# Patient Record
Sex: Male | Born: 1970
Health system: Southern US, Community
[De-identification: ages and names within clinical notes are randomized; demographics above are authoritative.]

## PROBLEM LIST (undated history)

## (undated) DIAGNOSIS — Z87442 Personal history of urinary calculi: Secondary | ICD-10-CM

## (undated) DIAGNOSIS — M1990 Unspecified osteoarthritis, unspecified site: Secondary | ICD-10-CM

## (undated) DIAGNOSIS — G479 Sleep disorder, unspecified: Secondary | ICD-10-CM

## (undated) DIAGNOSIS — M87051 Idiopathic aseptic necrosis of right femur: Secondary | ICD-10-CM

## (undated) HISTORY — PX: JOINT REPLACEMENT: SHX530

---

## 2010-10-20 ENCOUNTER — Emergency Department (HOSPITAL_COMMUNITY)
Admission: EM | Admit: 2010-10-20 | Discharge: 2010-10-21 | Disposition: A | Discharge status: Home / Self Care | Payer: BC Managed Care – PPO | Attending: Emergency Medicine | Admitting: Emergency Medicine

## 2010-10-20 ENCOUNTER — Emergency Department (HOSPITAL_COMMUNITY): Payer: BC Managed Care – PPO

## 2010-10-20 DIAGNOSIS — R109 Unspecified abdominal pain: Secondary | ICD-10-CM | POA: Insufficient documentation

## 2010-10-20 DIAGNOSIS — M48061 Spinal stenosis, lumbar region without neurogenic claudication: Secondary | ICD-10-CM | POA: Insufficient documentation

## 2010-10-20 DIAGNOSIS — E860 Dehydration: Secondary | ICD-10-CM | POA: Insufficient documentation

## 2010-10-20 DIAGNOSIS — Z87442 Personal history of urinary calculi: Secondary | ICD-10-CM | POA: Insufficient documentation

## 2010-10-20 LAB — DIFFERENTIAL
Eosinophils Absolute: 0.2 10*3/uL (ref 0.0–0.7)
Eosinophils Relative: 2 % (ref 0–5)
Lymphs Abs: 2.6 10*3/uL (ref 0.7–4.0)
Monocytes Relative: 9 % (ref 3–12)

## 2010-10-20 LAB — URINALYSIS, ROUTINE W REFLEX MICROSCOPIC
Glucose, UA: NEGATIVE mg/dL
Hgb urine dipstick: NEGATIVE
Ketones, ur: NEGATIVE mg/dL
Protein, ur: NEGATIVE mg/dL

## 2010-10-20 LAB — CBC
MCH: 31.5 pg (ref 26.0–34.0)
MCV: 93.2 fL (ref 78.0–100.0)
Platelets: 287 10*3/uL (ref 150–400)
RBC: 5.02 MIL/uL (ref 4.22–5.81)
RDW: 14.5 % (ref 11.5–15.5)

## 2010-10-21 LAB — POCT I-STAT, CHEM 8
BUN: 25 mg/dL — ABNORMAL HIGH (ref 6–23)
Calcium, Ion: 1.07 mmol/L — ABNORMAL LOW (ref 1.12–1.32)
Calcium, Ion: 1.16 mmol/L (ref 1.12–1.32)
Chloride: 109 mEq/L (ref 96–112)
Creatinine, Ser: 1.3 mg/dL (ref 0.50–1.35)
Creatinine, Ser: 1.4 mg/dL — ABNORMAL HIGH (ref 0.50–1.35)
Glucose, Bld: 94 mg/dL (ref 70–99)
HCT: 48 % (ref 39.0–52.0)
TCO2: 27 mmol/L (ref 0–100)

## 2013-09-05 HISTORY — PX: BACK SURGERY: SHX140

## 2014-05-23 ENCOUNTER — Ambulatory Visit
Admission: RE | Admit: 2014-05-23 | Discharge: 2014-05-23 | Disposition: A | Discharge status: Home / Self Care | Payer: BLUE CROSS/BLUE SHIELD | Source: Ambulatory Visit | Attending: Physical Medicine and Rehabilitation | Admitting: Physical Medicine and Rehabilitation

## 2014-05-23 ENCOUNTER — Other Ambulatory Visit: Payer: Self-pay | Admitting: Physical Medicine and Rehabilitation

## 2014-05-23 DIAGNOSIS — M25551 Pain in right hip: Secondary | ICD-10-CM

## 2014-05-26 ENCOUNTER — Other Ambulatory Visit: Payer: Self-pay | Admitting: Physical Medicine and Rehabilitation

## 2014-05-26 DIAGNOSIS — M1990 Unspecified osteoarthritis, unspecified site: Secondary | ICD-10-CM

## 2014-05-29 ENCOUNTER — Ambulatory Visit
Admission: RE | Admit: 2014-05-29 | Discharge: 2014-05-29 | Disposition: A | Discharge status: Home / Self Care | Payer: BLUE CROSS/BLUE SHIELD | Source: Ambulatory Visit | Attending: Physical Medicine and Rehabilitation | Admitting: Physical Medicine and Rehabilitation

## 2014-05-29 ENCOUNTER — Other Ambulatory Visit: Payer: Self-pay | Admitting: Physical Medicine and Rehabilitation

## 2014-05-29 DIAGNOSIS — Z139 Encounter for screening, unspecified: Secondary | ICD-10-CM

## 2014-06-01 ENCOUNTER — Ambulatory Visit
Admission: RE | Admit: 2014-06-01 | Discharge: 2014-06-01 | Disposition: A | Discharge status: Home / Self Care | Payer: BLUE CROSS/BLUE SHIELD | Source: Ambulatory Visit | Attending: Physical Medicine and Rehabilitation | Admitting: Physical Medicine and Rehabilitation

## 2014-06-01 DIAGNOSIS — M1990 Unspecified osteoarthritis, unspecified site: Secondary | ICD-10-CM

## 2014-07-05 NOTE — Progress Notes (Signed)
Please put orders in Epic surgery 07-18-14 pre op 07-06-14 Thanks

## 2014-07-05 NOTE — Patient Instructions (Addendum)
Alec Key  07/05/2014   Your procedure is scheduled on: 07/18/14   Report to [facility] Main  Entrance and follow signs to               Short Stay Center at 11:15 Key.   Call this number if you have problems the morning of surgery [phone]   Remember:  Do not eat food After Midnight.                             Alec Key               CLEAR LIQUID DIET   Foods Allowed                                                                     Foods Excluded  Coffee and tea, regular and decaf                             liquids that you cannot  Plain Jell-O in any flavor                                             see through such as: Fruit ices (not with fruit pulp)                                     milk, soups, orange juice  Iced Popsicles                                                All solid food Carbonated beverages, regular and diet                                    Cranberry, grape and apple juices Sports drinks like Gatorade Lightly seasoned clear broth or consume(fat free) Sugar, honey syrup   _____________________________________________________________________   Take these medicines the morning of surgery with A SIP OF WATER: TRAMADOL IF NEEDED                               You Alec not have any metal on your body including hair pins and              piercings  Do not wear jewelry, make-up, lotions, powders or perfumes.             Do not wear nail polish.  Do not shave  48 hours prior to surgery.              Men Alec shave face and neck.   Do not bring valuables to the  hospital. [facility] IS NOT             RESPONSIBLE   FOR VALUABLES.  Contacts, dentures or bridgework Alec not be worn into surgery.  Leave suitcase in the car. After surgery it Alec be brought to your room.     Patients discharged the day of surgery will not be allowed to drive home.  Name and phone number of your driver:  Special  Instructions: N/A              Please read over the following fact sheets you were given: _____________________________________________________________________                                                     [name] - PREPARING FOR SURGERY  Before surgery, you can play an important role.  Because skin is not sterile, your skin needs to be as free of germs as possible.  You can reduce the number of germs on your skin by washing with CHG (chlorahexidine gluconate) soap before surgery.  CHG is an antiseptic cleaner which kills germs and bonds with the skin to continue killing germs even after washing. Please DO NOT use if you have an allergy to CHG or antibacterial soaps.  If your skin becomes reddened/irritated stop using the CHG and inform your nurse when you arrive at Short Stay. Do not shave (including legs and underarms) for at least 48 hours prior to the first CHG shower.  You Alec shave your face. Please follow these instructions carefully:   1.  Shower with CHG Soap the night before surgery and the  morning of Surgery.   2.  If you choose to wash your hair, wash your hair first as usual with your  normal  Shampoo.   3.  After you shampoo, rinse your hair and body thoroughly to remove the  shampoo.                                         4.  Use CHG as you would any other liquid soap.  You can apply chg directly  to the skin and wash . Gently wash with scrungie or clean wascloth    5.  Apply the CHG Soap to your body ONLY FROM THE NECK DOWN.   Do not use on open                           Wound or open sores. Avoid contact with eyes, ears mouth and genitals (private parts).                        Genitals (private parts) with your normal soap.              6.  Wash thoroughly, paying special attention to the area where your surgery  will be performed.   7.  Thoroughly rinse your body with warm water from the neck down.   8.  DO NOT shower/wash with your normal soap after  using and rinsing off  the CHG Soap .                9.  Pat yourself  dry with a clean towel.             10.  Wear clean pajamas.             11.  Place clean sheets on your bed the night of your first shower and do not  sleep with pets.  Day of Surgery : Do not apply any lotions/deodorants the morning of surgery.  Please wear clean clothes to the hospital/surgery center.  FAILURE TO FOLLOW THESE INSTRUCTIONS Alec RESULT IN THE CANCELLATION OF YOUR SURGERY    PATIENT SIGNATURE_________________________________  ______________________________________________________________________     Rogelia MireIncentive Spirometer  An incentive spirometer is a tool that can help keep your lungs clear and active. This tool measures how well you are filling your lungs with each breath. Taking long deep breaths Alec help reverse or decrease the chance of developing breathing (pulmonary) problems (especially infection) following:  A long period of time when you are unable to move or be active. BEFORE THE PROCEDURE   If the spirometer includes an indicator to show your best effort, your nurse or respiratory therapist will set it to a desired goal.  If possible, sit up straight or lean slightly forward. Try not to slouch.  Hold the incentive spirometer in an upright position. INSTRUCTIONS FOR USE   Sit on the edge of your bed if possible, or sit up as far as you can in bed or on a chair.  Hold the incentive spirometer in an upright position.  Breathe out normally.  Place the mouthpiece in your mouth and seal your lips tightly around it.  Breathe in slowly and as deeply as possible, raising the piston or the ball toward the top of the column.  Hold your breath for 3-5 seconds or for as long as possible. Allow the piston or ball to fall to the bottom of the column.  Remove the mouthpiece from your mouth and breathe out normally.  Rest for a few seconds and repeat Steps 1 through 7 at least 10 times  every 1-2 hours when you are awake. Take your time and take a few normal breaths between deep breaths.  The spirometer Alec include an indicator to show your best effort. Use the indicator as a goal to work toward during each repetition.  After each set of 10 deep breaths, practice coughing to be sure your lungs are clear. If you have an incision (the cut made at the time of surgery), support your incision when coughing by placing a pillow or rolled up towels firmly against it. Once you are able to get out of bed, walk around indoors and cough well. You Alec stop using the incentive spirometer when instructed by your caregiver.  RISKS AND COMPLICATIONS  Take your time so you do not get dizzy or light-headed.  If you are in pain, you Alec need to take or ask for pain medication before doing incentive spirometry. It is harder to take a deep breath if you are having pain. AFTER USE  Rest and breathe slowly and easily.  It can be helpful to keep track of a log of your progress. Your caregiver can provide you with a simple table to help with this. If you are using the spirometer at home, follow these instructions: SEEK MEDICAL CARE IF:   You are having difficultly using the spirometer.  You have trouble using the spirometer as often as instructed.  Your pain medication is not giving enough relief while using the spirometer.  You  develop fever of 100.5 F (38.1 C) or higher. SEEK IMMEDIATE MEDICAL CARE IF:   You cough up bloody sputum that had not been present before.  You develop fever of 102 F (38.9 C) or greater.  You develop worsening pain at or near the incision site. MAKE SURE YOU:   Understand these instructions.  Will watch your condition.  Will get help right away if you are not doing well or get worse. Document Released: 08/04/2006 Document Revised: 06/16/2011 Document Reviewed: 10/05/2006 Exit[employer] Patient Information 2014 ExitCare,  [place].   ________________________________________________________________________  WHAT IS A BLOOD TRANSFUSION? Blood Transfusion Information  A transfusion is the replacement of blood or some of its parts. Blood is made up of multiple cells which provide different functions.  Red blood cells carry oxygen and are used for blood loss replacement.  White blood cells fight against infection.  Platelets control bleeding.  Plasma helps clot blood.  Other blood products are available for specialized needs, such as hemophilia or other clotting disorders. BEFORE THE TRANSFUSION  Who gives blood for transfusions?   Healthy volunteers who are fully evaluated to make sure their blood is safe. This is blood bank blood. Transfusion therapy is the safest it has ever been in the practice of medicine. Before blood is taken from a donor, a complete history is taken to make sure that person has no history of diseases nor engages in risky social behavior (examples are intravenous drug use or sexual activity with multiple partners). The donor's travel history is screened to minimize risk of transmitting infections, such as malaria. The donated blood is tested for signs of infectious diseases, such as HIV and hepatitis. The blood is then tested to be sure it is compatible with you in order to minimize the chance of a transfusion reaction. If you or a relative donates blood, this is often done in anticipation of surgery and is not appropriate for emergency situations. It takes many days to process the donated blood. RISKS AND COMPLICATIONS Although transfusion therapy is very safe and saves many lives, the main dangers of transfusion include:   Getting an infectious disease.  Developing a transfusion reaction. This is an allergic reaction to something in the blood you were given. Every precaution is taken to prevent this. The decision to have a blood transfusion has been considered carefully by your caregiver  before blood is given. Blood is not given unless the benefits outweigh the risks. AFTER THE TRANSFUSION  Right after receiving a blood transfusion, you will usually feel much better and more energetic. This is especially true if your red blood cells have gotten low (anemic). The transfusion raises the level of the red blood cells which carry oxygen, and this usually causes an energy increase.  The nurse administering the transfusion will monitor you carefully for complications. HOME CARE INSTRUCTIONS  No special instructions are needed after a transfusion. You Alec find your energy is better. Speak with your caregiver about any limitations on activity for underlying diseases you Alec have. SEEK MEDICAL CARE IF:   Your condition is not improving after your transfusion.  You develop redness or irritation at the intravenous (IV) site. SEEK IMMEDIATE MEDICAL CARE IF:  Any of the following symptoms occur over the next 12 hours:  Shaking chills.  You have a temperature by mouth above 102 F (38.9 C), not controlled by medicine.  Chest, back, or muscle pain.  People around you feel you are not acting correctly or are confused.  Shortness of  breath or difficulty breathing.  Dizziness and fainting.  You get a rash or develop hives.  You have a decrease in urine output.  Your urine turns a dark color or changes to pink, red, or brown. Any of the following symptoms occur over the next 10 days:  You have a temperature by mouth above 102 F (38.9 C), not controlled by medicine.  Shortness of breath.  Weakness after normal activity.  The white part of the eye turns yellow (jaundice).  You have a decrease in the amount of urine or are urinating less often.  Your urine turns a dark color or changes to pink, red, or brown. Document Released: 03/21/2000 Document Revised: 06/16/2011 Document Reviewed: 11/08/2007 [facility] Patient Information 2014 [place],  [place].  _______________________________________________________________________

## 2014-07-06 ENCOUNTER — Encounter (HOSPITAL_COMMUNITY)
Admission: RE | Admit: 2014-07-06 | Discharge: 2014-07-06 | Disposition: A | Discharge status: Home / Self Care | Payer: BLUE CROSS/BLUE SHIELD | Source: Ambulatory Visit | Attending: Orthopedic Surgery | Admitting: Orthopedic Surgery

## 2014-07-06 ENCOUNTER — Encounter (HOSPITAL_COMMUNITY): Payer: Self-pay

## 2014-07-06 DIAGNOSIS — Z01812 Encounter for preprocedural laboratory examination: Secondary | ICD-10-CM | POA: Insufficient documentation

## 2014-07-06 HISTORY — DX: Idiopathic aseptic necrosis of right femur: M87.051

## 2014-07-06 HISTORY — DX: Sleep disorder, unspecified: G47.9

## 2014-07-06 HISTORY — DX: Unspecified osteoarthritis, unspecified site: M19.90

## 2014-07-06 HISTORY — DX: Personal history of urinary calculi: Z87.442

## 2014-07-06 LAB — URINALYSIS, ROUTINE W REFLEX MICROSCOPIC
Bilirubin Urine: NEGATIVE
Glucose, UA: NEGATIVE mg/dL
Hgb urine dipstick: NEGATIVE
KETONES UR: NEGATIVE mg/dL
LEUKOCYTES UA: NEGATIVE
Nitrite: NEGATIVE
PROTEIN: NEGATIVE mg/dL
Specific Gravity, Urine: 1.026 (ref 1.005–1.030)
Urobilinogen, UA: 0.2 mg/dL (ref 0.0–1.0)
pH: 5 (ref 5.0–8.0)

## 2014-07-06 LAB — BASIC METABOLIC PANEL
Anion gap: 9 (ref 5–15)
BUN: 15 mg/dL (ref 6–23)
CALCIUM: 9.2 mg/dL (ref 8.4–10.5)
CO2: 28 mmol/L (ref 19–32)
Chloride: 102 mmol/L (ref 96–112)
Creatinine, Ser: 1.08 mg/dL (ref 0.50–1.35)
GFR calc Af Amer: >90 mL/min (ref >90)
GFR, EST NON AFRICAN AMERICAN: 83 mL/min — AB (ref >90)
GLUCOSE: 95 mg/dL (ref 70–99)
Potassium: 4.5 mmol/L (ref 3.5–5.1)
SODIUM: 139 mmol/L (ref 135–145)

## 2014-07-06 LAB — PROTIME-INR
INR: 0.94 (ref 0.00–1.49)
PROTHROMBIN TIME: 12.7 s (ref 11.6–15.2)

## 2014-07-06 LAB — CBC
HCT: 47.4 % (ref 39.0–52.0)
HEMOGLOBIN: 15.8 g/dL (ref 13.0–17.0)
MCH: 31 pg (ref 26.0–34.0)
MCHC: 33.3 g/dL (ref 30.0–36.0)
MCV: 93.1 fL (ref 78.0–100.0)
Platelets: 310 10*3/uL (ref 150–400)
RBC: 5.09 MIL/uL (ref 4.22–5.81)
RDW: 14.2 % (ref 11.5–15.5)
WBC: 12.8 10*3/uL — AB (ref 4.0–10.5)

## 2014-07-06 LAB — APTT: aPTT: 34 seconds (ref 24–37)

## 2014-07-06 LAB — SURGICAL PCR SCREEN
MRSA, PCR: INVALID — AB
STAPHYLOCOCCUS AUREUS: INVALID — AB

## 2014-07-10 NOTE — Progress Notes (Signed)
PCR was not sent for culture by the lab.  Will be repeated on day of surgery.

## 2014-07-16 NOTE — H&P (Signed)
TOTAL HIP ADMISSION H&P  Patient is admitted for right total hip arthroplasty, anterior approach (ceramic-on-ceramic).  Subjective:  Chief Complaint:  Right hip AVN / pain  HPI: Deno EtienneMark Fristoe, 44 y.o. male, has a history of pain and functional disability in the right hip(s) due to arthritis and AVN and patient has failed non-surgical conservative treatments for greater than 12 weeks to include NSAID's and/or analgesics, corticosteriod injections and activity modification.  Onset of symptoms was gradual starting 3+ years ago with gradually worsening course since that time.The patient noted no past surgery on the right hip(s).  Patient currently rates pain in the right hip at 9 out of 10 with activity. Patient has night pain, worsening of pain with activity and weight bearing, trendelenberg gait, pain that interfers with activities of daily living and pain with passive range of motion. Patient has evidence of periarticular osteophytes and joint space narrowing by imaging studies. This condition presents safety issues increasing the risk of falls.  There is no current active infection.  Risks, benefits and expectations were discussed with the patient.  Risks including but not limited to the risk of anesthesia, blood clots, nerve damage, blood vessel damage, failure of the prosthesis, infection and up to and including death.  Patient understand the risks, benefits and expectations and wishes to proceed with surgery.   PCP: Johny BlamerHARRIS, WILLIAM, MD  D/C Plans:      Home with HHPT (Outpatient)  Post-op Meds:       No Rx given  Tranexamic Acid:      To be given - IV    Decadron:      Is to be given  FYI:     ASA post-op  Norco post-op    Past Medical History  Diagnosis Date  . Arthritis   . History of kidney stones   . Difficulty sleeping     DUE TO HIP PAIN  . Avascular necrosis of bone of right hip     Past Surgical History  Procedure Laterality Date  . Back surgery  june 2015    L4-L5  DISCECTOMY    No prescriptions prior to admission   No Known Allergies   History  Substance Use Topics  . Smoking status: Current Every Day Smoker -- 1.50 packs/day  . Smokeless tobacco: Not on file  . Alcohol Use: Yes     Comment: 4-5 BEERS DAILY       Review of Systems  Constitutional: Negative.   HENT: Negative.   Eyes: Negative.   Respiratory: Negative.   Cardiovascular: Negative.   Gastrointestinal: Negative.   Genitourinary: Negative.   Musculoskeletal: Positive for joint pain.  Skin: Negative.   Neurological: Negative.   Endo/Heme/Allergies: Negative.   Psychiatric/Behavioral: Negative.     Objective:  Physical Exam  Constitutional: He is oriented to person, place, and time. He appears well-developed and well-nourished.  HENT:  Head: Normocephalic.  Eyes: Pupils are equal, round, and reactive to light.  Neck: Neck supple. No JVD present. No tracheal deviation present. No thyromegaly present.  Cardiovascular: Normal rate, regular rhythm, normal heart sounds and intact distal pulses.   Respiratory: Effort normal and breath sounds normal. No stridor. No respiratory distress. He has no wheezes.  GI: Soft. There is no tenderness. There is no guarding.  Musculoskeletal:       Right hip: He exhibits decreased range of motion, decreased strength, tenderness and bony tenderness. He exhibits no swelling, no deformity and no laceration.  Lymphadenopathy:    He has  no cervical adenopathy.  Neurological: He is alert and oriented to person, place, and time.  Skin: Skin is warm and dry.  Psychiatric: He has a normal mood and affect.       Imaging Review Plain radiographs demonstrate severe degenerative joint disease of the right hip(s). The bone quality appears to be good for age and reported activity level.  Assessment/Plan:  End stage arthritis / AVN, right hip(s)  The patient history, physical examination, clinical judgement of the provider and imaging studies  are consistent with end stage degenerative joint disease / AVN of the right hip(s) and total hip arthroplasty is deemed medically necessary. The treatment options including medical management, injection therapy, arthroscopy and arthroplasty were discussed at length. The risks and benefits of total hip arthroplasty were presented and reviewed. The risks due to aseptic loosening, infection, stiffness, dislocation/subluxation,  thromboembolic complications and other imponderables were discussed.  The patient acknowledged the explanation, agreed to proceed with the plan and consent was signed. Patient is being admitted for inpatient treatment for surgery, pain control, PT, OT, prophylactic antibiotics, VTE prophylaxis, progressive ambulation and ADL's and discharge planning.The patient is planning to be discharged home with home health services.     Anastasio Auerbach Janice Seales   PA-C  07/16/2014, 2:08 PM

## 2014-07-18 ENCOUNTER — Ambulatory Visit (HOSPITAL_COMMUNITY): Payer: BLUE CROSS/BLUE SHIELD | Admitting: Anesthesiology

## 2014-07-18 ENCOUNTER — Encounter (HOSPITAL_COMMUNITY): Payer: Self-pay | Admitting: *Deleted

## 2014-07-18 ENCOUNTER — Ambulatory Visit (HOSPITAL_COMMUNITY): Payer: BLUE CROSS/BLUE SHIELD

## 2014-07-18 ENCOUNTER — Encounter (HOSPITAL_COMMUNITY)
Admission: RE | Disposition: A | Discharge status: Home / Self Care | Payer: Self-pay | Source: Ambulatory Visit | Attending: Orthopedic Surgery

## 2014-07-18 ENCOUNTER — Ambulatory Visit (HOSPITAL_COMMUNITY)
Admission: RE | Admit: 2014-07-18 | Discharge: 2014-07-18 | Disposition: A | Discharge status: Home / Self Care | Payer: BLUE CROSS/BLUE SHIELD | Source: Ambulatory Visit | Attending: Orthopedic Surgery | Admitting: Orthopedic Surgery

## 2014-07-18 DIAGNOSIS — M1990 Unspecified osteoarthritis, unspecified site: Secondary | ICD-10-CM | POA: Diagnosis not present

## 2014-07-18 DIAGNOSIS — Z96649 Presence of unspecified artificial hip joint: Secondary | ICD-10-CM

## 2014-07-18 DIAGNOSIS — Z87442 Personal history of urinary calculi: Secondary | ICD-10-CM | POA: Diagnosis not present

## 2014-07-18 DIAGNOSIS — M879 Osteonecrosis, unspecified: Secondary | ICD-10-CM | POA: Insufficient documentation

## 2014-07-18 DIAGNOSIS — F17210 Nicotine dependence, cigarettes, uncomplicated: Secondary | ICD-10-CM | POA: Insufficient documentation

## 2014-07-18 HISTORY — PX: TOTAL HIP ARTHROPLASTY: SHX124

## 2014-07-18 LAB — SURGICAL PCR SCREEN
MRSA, PCR: NEGATIVE
Staphylococcus aureus: NEGATIVE

## 2014-07-18 LAB — ABO/RH: ABO/RH(D): O POS

## 2014-07-18 LAB — TYPE AND SCREEN
ABO/RH(D): O POS
ANTIBODY SCREEN: NEGATIVE

## 2014-07-18 SURGERY — ARTHROPLASTY, HIP, TOTAL, ANTERIOR APPROACH
Anesthesia: Spinal | Site: Hip | Laterality: Right

## 2014-07-18 MED ORDER — MIDAZOLAM HCL 2 MG/2ML IJ SOLN
INTRAMUSCULAR | Status: AC
  Filled 2014-07-18: qty 2

## 2014-07-18 MED ORDER — METHOCARBAMOL 500 MG PO TABS
500.0000 mg | ORAL_TABLET | Freq: Four times a day (QID) | ORAL | Status: DC
  Administered 2014-07-18: 500 mg via ORAL
  Filled 2014-07-18: qty 1

## 2014-07-18 MED ORDER — PROPOFOL 10 MG/ML IV BOLUS
INTRAVENOUS | Status: AC
  Filled 2014-07-18: qty 20

## 2014-07-18 MED ORDER — METHOCARBAMOL 500 MG PO TABS: 500.0000 mg | ORAL_TABLET | Freq: Four times a day (QID) | ORAL | Status: DC | PRN

## 2014-07-18 MED ORDER — BUPIVACAINE IN DEXTROSE 0.75-8.25 % IT SOLN
INTRATHECAL | Status: DC | PRN
  Administered 2014-07-18: 2 mL via INTRATHECAL

## 2014-07-18 MED ORDER — LACTATED RINGERS IV SOLN
INTRAVENOUS | Status: DC | PRN
  Administered 2014-07-18 (×2): via INTRAVENOUS

## 2014-07-18 MED ORDER — PROPOFOL 10 MG/ML IV BOLUS
INTRAVENOUS | Status: DC | PRN
  Administered 2014-07-18 (×3): 20 mg via INTRAVENOUS
  Administered 2014-07-18: 40 mg via INTRAVENOUS

## 2014-07-18 MED ORDER — CEFAZOLIN SODIUM-DEXTROSE 2-3 GM-% IV SOLR
INTRAVENOUS | Status: AC
  Filled 2014-07-18: qty 50

## 2014-07-18 MED ORDER — DEXAMETHASONE SODIUM PHOSPHATE 10 MG/ML IJ SOLN
10.0000 mg | Freq: Once | INTRAMUSCULAR | Status: AC
  Administered 2014-07-18: 10 mg via INTRAVENOUS

## 2014-07-18 MED ORDER — HYDROMORPHONE HCL 1 MG/ML IJ SOLN
INTRAMUSCULAR | Status: AC
  Filled 2014-07-18: qty 1

## 2014-07-18 MED ORDER — HYDROCODONE-ACETAMINOPHEN 7.5-325 MG PO TABS
1.0000 | ORAL_TABLET | ORAL | Status: DC | PRN
Start: 2014-07-18 — End: 2019-01-03

## 2014-07-18 MED ORDER — ASPIRIN EC 325 MG PO TBEC: 325.0000 mg | DELAYED_RELEASE_TABLET | Freq: Two times a day (BID) | ORAL | Status: AC

## 2014-07-18 MED ORDER — PROMETHAZINE HCL 25 MG/ML IJ SOLN: 6.2500 mg | INTRAMUSCULAR | Status: DC | PRN

## 2014-07-18 MED ORDER — SODIUM CHLORIDE 0.9 % IR SOLN
Status: DC | PRN
  Administered 2014-07-18: 1000 mL

## 2014-07-18 MED ORDER — TRANEXAMIC ACID 100 MG/ML IV SOLN
1000.0000 mg | Freq: Once | INTRAVENOUS | Status: AC
  Administered 2014-07-18: 1000 mg via INTRAVENOUS
  Filled 2014-07-18: qty 10

## 2014-07-18 MED ORDER — HYDROCODONE-ACETAMINOPHEN 7.5-325 MG PO TABS
1.0000 | ORAL_TABLET | Freq: Once | ORAL | Status: AC
  Administered 2014-07-18: 1 via ORAL
  Filled 2014-07-18: qty 1

## 2014-07-18 MED ORDER — HYDROCODONE-ACETAMINOPHEN 7.5-325 MG PO TABS
1.0000 | ORAL_TABLET | ORAL | Status: DC | PRN
  Administered 2014-07-18: 1 via ORAL
  Filled 2014-07-18: qty 1

## 2014-07-18 MED ORDER — CHLORHEXIDINE GLUCONATE 4 % EX LIQD: 60.0000 mL | Freq: Once | CUTANEOUS | Status: DC

## 2014-07-18 MED ORDER — CEPHALEXIN 500 MG PO CAPS: 500.0000 mg | ORAL_CAPSULE | Freq: Three times a day (TID) | ORAL | Status: DC

## 2014-07-18 MED ORDER — MUPIROCIN 2 % EX OINT
1.0000 "application " | TOPICAL_OINTMENT | Freq: Once | CUTANEOUS | Status: AC
  Administered 2014-07-18: 1 via TOPICAL
  Filled 2014-07-18: qty 22

## 2014-07-18 MED ORDER — LACTATED RINGERS IV SOLN: INTRAVENOUS | Status: DC

## 2014-07-18 MED ORDER — HYDROMORPHONE HCL 1 MG/ML IJ SOLN
0.2500 mg | INTRAMUSCULAR | Status: DC | PRN
  Administered 2014-07-18 (×4): 0.5 mg via INTRAVENOUS

## 2014-07-18 MED ORDER — FENTANYL CITRATE 0.05 MG/ML IJ SOLN
INTRAMUSCULAR | Status: AC
  Filled 2014-07-18: qty 2

## 2014-07-18 MED ORDER — FENTANYL CITRATE 0.05 MG/ML IJ SOLN
INTRAMUSCULAR | Status: DC | PRN
  Administered 2014-07-18 (×2): 50 ug via INTRAVENOUS

## 2014-07-18 MED ORDER — DEXAMETHASONE SODIUM PHOSPHATE 10 MG/ML IJ SOLN
INTRAMUSCULAR | Status: AC
  Filled 2014-07-18: qty 1

## 2014-07-18 MED ORDER — CELECOXIB 200 MG PO CAPS: 200.0000 mg | ORAL_CAPSULE | Freq: Two times a day (BID) | ORAL | Status: DC

## 2014-07-18 MED ORDER — MIDAZOLAM HCL 5 MG/5ML IJ SOLN
INTRAMUSCULAR | Status: DC | PRN
  Administered 2014-07-18: 2 mg via INTRAVENOUS

## 2014-07-18 MED ORDER — CEFAZOLIN SODIUM-DEXTROSE 2-3 GM-% IV SOLR
2.0000 g | INTRAVENOUS | Status: AC
  Administered 2014-07-18: 2 g via INTRAVENOUS

## 2014-07-18 MED ORDER — PROPOFOL INFUSION 10 MG/ML OPTIME
INTRAVENOUS | Status: DC | PRN
  Administered 2014-07-18: 75 ug/kg/min via INTRAVENOUS

## 2014-07-18 SURGICAL SUPPLY — 44 items
BAG DECANTER FOR FLEXI CONT (MISCELLANEOUS) IMPLANT
BAG ZIPLOCK 12X15 (MISCELLANEOUS) IMPLANT
CAPT HIP TOTAL 3 ×2 IMPLANT
COVER PERINEAL POST (MISCELLANEOUS) ×2 IMPLANT
DERMABOND ADVANCED (GAUZE/BANDAGES/DRESSINGS) ×1
DERMABOND ADVANCED .7 DNX12 (GAUZE/BANDAGES/DRESSINGS) ×1 IMPLANT
DRAPE C-ARM 42X120 X-RAY (DRAPES) ×2 IMPLANT
DRAPE STERI IOBAN 125X83 (DRAPES) ×2 IMPLANT
DRAPE U-SHAPE 47X51 STRL (DRAPES) ×6 IMPLANT
DRSG AQUACEL AG ADV 3.5X10 (GAUZE/BANDAGES/DRESSINGS) ×2 IMPLANT
DURAPREP 26ML APPLICATOR (WOUND CARE) ×2 IMPLANT
ELECT BLADE TIP CTD 4 INCH (ELECTRODE) ×2 IMPLANT
ELECT PENCIL ROCKER SW 15FT (MISCELLANEOUS) ×2 IMPLANT
ELECT REM PT RETURN 15FT ADLT (MISCELLANEOUS) IMPLANT
ELECT REM PT RETURN 9FT ADLT (ELECTROSURGICAL) ×2
ELECTRODE REM PT RTRN 9FT ADLT (ELECTROSURGICAL) ×1 IMPLANT
FACESHIELD WRAPAROUND (MASK) ×8 IMPLANT
GLOVE BIOGEL PI IND STRL 7.5 (GLOVE) ×1 IMPLANT
GLOVE BIOGEL PI IND STRL 8.5 (GLOVE) ×1 IMPLANT
GLOVE BIOGEL PI INDICATOR 7.5 (GLOVE) ×1
GLOVE BIOGEL PI INDICATOR 8.5 (GLOVE) ×1
GLOVE ECLIPSE 8.0 STRL XLNG CF (GLOVE) ×4 IMPLANT
GLOVE ORTHO TXT STRL SZ7.5 (GLOVE) ×2 IMPLANT
GOWN SPEC L3 XXLG W/TWL (GOWN DISPOSABLE) ×2 IMPLANT
GOWN STRL REUS W/TWL LRG LVL3 (GOWN DISPOSABLE) ×2 IMPLANT
HOLDER FOLEY CATH W/STRAP (MISCELLANEOUS) ×2 IMPLANT
KIT BASIN OR (CUSTOM PROCEDURE TRAY) ×2 IMPLANT
LIQUID BAND (GAUZE/BANDAGES/DRESSINGS) ×2 IMPLANT
NDL SAFETY ECLIPSE 18X1.5 (NEEDLE) IMPLANT
NEEDLE HYPO 18GX1.5 SHARP (NEEDLE)
PACK TOTAL JOINT (CUSTOM PROCEDURE TRAY) ×2 IMPLANT
PEN SKIN MARKING BROAD (MISCELLANEOUS) ×2 IMPLANT
SAW OSC TIP CART 19.5X105X1.3 (SAW) ×2 IMPLANT
SUT MNCRL AB 4-0 PS2 18 (SUTURE) ×2 IMPLANT
SUT VIC AB 1 CT1 36 (SUTURE) ×6 IMPLANT
SUT VIC AB 2-0 CT1 27 (SUTURE) ×2
SUT VIC AB 2-0 CT1 TAPERPNT 27 (SUTURE) ×2 IMPLANT
SUT VLOC 180 0 24IN GS25 (SUTURE) ×2 IMPLANT
SYR 50ML LL SCALE MARK (SYRINGE) IMPLANT
TOWEL OR 17X26 10 PK STRL BLUE (TOWEL DISPOSABLE) ×2 IMPLANT
TOWEL OR NON WOVEN STRL DISP B (DISPOSABLE) IMPLANT
TRAY FOLEY W/METER SILVER 16FR (SET/KITS/TRAYS/PACK) ×2 IMPLANT
WATER STERILE IRR 1500ML POUR (IV SOLUTION) ×2 IMPLANT
YANKAUER SUCT BULB TIP 10FT TU (MISCELLANEOUS) ×2 IMPLANT

## 2014-07-18 NOTE — Progress Notes (Signed)
   07/18/14 1518  PT Time Calculation  PT Start Time (ACUTE ONLY) 1441  PT Stop Time (ACUTE ONLY) 1501  PT Time Calculation (min) (ACUTE ONLY) 20 min  PT G-Codes **NOT FOR INPATIENT CLASS**  Functional Assessment Tool Used (clinical judgement)  Functional Limitation Mobility: Walking and moving around  Mobility: Walking and Moving Around Current Status (J1914(G8978) CI  Mobility: Walking and Moving Around Goal Status (N8295(G8979) CI  Mobility: Walking and Moving Around Discharge Status (A2130(G8980) CI  PT General Charges  $$ ACUTE PT VISIT 1 Procedure  PT Evaluation  $Initial PT Evaluation Tier I 1 Procedure   Rebeca AlertJannie Angela Vazguez, MPT [phone]

## 2014-07-18 NOTE — Progress Notes (Signed)
[facility]

## 2014-07-18 NOTE — Anesthesia Preprocedure Evaluation (Signed)
Anesthesia Evaluation  Patient identified by MRN, date of birth, ID band Patient awake    Reviewed: Allergy & Precautions, NPO status , Patient's Chart, lab work & pertinent test results  Airway Mallampati: II  TM Distance: >3 FB Neck ROM: Full    Dental no notable dental hx.    Pulmonary Current Smoker,  breath sounds clear to auscultation  Pulmonary exam normal       Cardiovascular negative cardio ROS  Rhythm:Regular Rate:Normal     Neuro/Psych negative neurological ROS  negative psych ROS   GI/Hepatic negative GI ROS, Neg liver ROS,   Endo/Other  negative endocrine ROS  Renal/GU negative Renal ROS  negative genitourinary   Musculoskeletal negative musculoskeletal ROS (+)   Abdominal   Peds negative pediatric ROS (+)  Hematology negative hematology ROS (+)   Anesthesia Other Findings   Reproductive/Obstetrics negative OB ROS                             Anesthesia Physical Anesthesia Plan  ASA: II  Anesthesia Plan: Spinal   Post-op Pain Management:    Induction: Intravenous  Airway Management Planned: Simple Face Mask  Additional Equipment:   Intra-op Plan:   Post-operative Plan:   Informed Consent: I have reviewed the patients History and Physical, chart, labs and discussed the procedure including the risks, benefits and alternatives for the proposed anesthesia with the patient or authorized representative who has indicated his/her understanding and acceptance.   Dental advisory given  Plan Discussed with: CRNA and Surgeon  Anesthesia Plan Comments:         Anesthesia Quick Evaluation

## 2014-07-18 NOTE — Anesthesia Procedure Notes (Signed)
Spinal Patient location during procedure: OR Start time: 07/18/2014 7:16 AM End time: 07/18/2014 7:21 AM Reason for block: at surgeon's request Staffing Resident/CRNA: Anne Fu Performed by: resident/CRNA  Preanesthetic Checklist Completed: patient identified, site marked, surgical consent, pre-op evaluation, timeout performed, IV checked, risks and benefits discussed, monitors and equipment checked and at surgeon's request Spinal Block Patient position: sitting Approach: right paramedian Location: L2-3 Injection technique: single-shot Needle Needle type: Whitacre  Needle gauge: 25 G Needle length: 9 cm Assessment Sensory level: T6 Additional Notes Expiration date of kit checked and confirmed. Patient tolerated procedure well, without complications. X 1 attempt with noted clear CSF return. Loss of motor and sensory on exam post injection.

## 2014-07-18 NOTE — Evaluation (Addendum)
Physical Therapy Evaluation-1x eval Patient Details Name: Alec Key MRN: [id number] DOB: 1970-12-15 Today's Date: 07/18/2014   History of Present Illness  44 yo male s/p R THA-direct anterior 07/18/14.   Clinical Impression  Eval POD 0-pt was overall Min guard assist for all mobility-able to walk ~90 feet with RW. Pt denied lightheadedness/dizziness during session. Able to stand unassisted for toileting. Pain rated as 7/10 with activity. Practiced ascending 1 step (onto step stool available in short stay) to assess ability to get up 2 steps into home-no significant difficulty noted and pt will have assist from parents. Reviewed car transfer technique. Pt has access to all DME needed. All education completed.    Follow Up Recommendations Home health PT    Equipment Recommendations  None recommended by PT    Recommendations for Other Services       Precautions / Restrictions Precautions Precautions: None Restrictions Weight Bearing Restrictions: No RLE Weight Bearing: Weight bearing as tolerated      Mobility  Bed Mobility Overal bed mobility: Needs Assistance Bed Mobility: Supine to Sit;Sit to Supine     Supine to sit: Min guard Sit to supine: Min assist   General bed mobility comments: increased time. small amount of assist for R LE back onto bed.   Transfers Overall transfer level: Needs assistance Equipment used: Rolling walker (2 wheeled) Transfers: Sit to/from Stand Sit to Stand: Min guard         General transfer comment: close guard for safety. VCs safety, technique, hand placement  Ambulation/Gait Ambulation/Gait assistance: Min guard Ambulation Distance (Feet): 90 Feet Assistive device: Rolling walker (2 wheeled) Gait Pattern/deviations: Step-through pattern;Decreased stride length;Decreased step length - right;Antalgic     General Gait Details: slow gait speed. some difficulty advancing R LE which is to be expected. VCs safety. Pt denied  lightheadedness/dizziness.   Stairs            Wheelchair Mobility    Modified Rankin (Stroke Patients Only)       Balance                                             Pertinent Vitals/Pain Pain Assessment: 0-10 Pain Score: 7  Pain Location: R hip/thigh area Pain Descriptors / Indicators: Sore;Tightness;Aching Pain Intervention(s): Monitored during session;Repositioned;Ice applied    Home Living Family/patient expects to be discharged to:: Private residence Living Arrangements: Parent   Type of Home: House Home Access: Stairs to enter   Technical brewer of Steps: 2-back Home Layout: One level Home Equipment: Crutches;Walker - 2 wheels;Wheelchair - manual;Bedside commode      Prior Function Level of Independence: Independent               Hand Dominance        Extremity/Trunk Assessment   Upper Extremity Assessment: Overall WFL for tasks assessed           Lower Extremity Assessment: RLE deficits/detail RLE Deficits / Details: at least: hip flex 2/5, knee ext 3/5, moves ankle well    Cervical / Trunk Assessment: Normal  Communication   Communication: No difficulties  Cognition Arousal/Alertness: Awake/alert Behavior During Therapy: WFL for tasks assessed/performed Overall Cognitive Status: Within Functional Limits for tasks assessed                      General Comments  Exercises Total Joint Exercises Ankle Circles/Pumps: AROM;Both;10 reps;Supine Quad Sets: AROM;Both;10 reps;Supine Heel Slides: AAROM;Right;10 reps;Supine Hip ABduction/ADduction: AAROM;Right;AROM;10 reps;Supine Long Arc Quad: AROM;Right;5 reps;Seated      Assessment/Plan    PT Assessment All further PT needs can be met in the next venue of care (HHPT)  PT Diagnosis Difficulty walking;Acute pain   PT Problem List Decreased strength;Decreased range of motion;Decreased activity tolerance;Decreased balance;Decreased  mobility;Pain;Decreased knowledge of use of DME  PT Treatment Interventions     PT Goals (Current goals can be found in the Care Plan section) Acute Rehab PT Goals Patient Stated Goal: less pain. regain independence. attend events/concerts within next 2 weeks or so. PT Goal Formulation: All assessment and education complete, DC therapy    Frequency     Barriers to discharge        Co-evaluation               End of Session Equipment Utilized During Treatment: Gait belt Activity Tolerance: Patient limited by pain Patient left: in bed;with call bell/phone within reach;with family/visitor present;with nursing/sitter in room           Time: 1441-1501 PT Time Calculation (min) (ACUTE ONLY): 20 min   Charges:   PT Evaluation $Initial PT Evaluation Tier I: 1 Procedure     PT G Codes:        Weston Anna, MPT Pager: [phone]

## 2014-07-18 NOTE — Progress Notes (Signed)
Notified Dr. Charlann Boxerlin of pt's increase in pain to 7/10.  Dr. Charlann Boxerlin ordered a second pain pill see MAR.

## 2014-07-18 NOTE — Transfer of Care (Signed)
Immediate Anesthesia Transfer of Care Note  Patient: Alec Key  Procedure(s) Performed: Procedure(s) (LRB): RIGHT TOTAL HIP ARTHROPLASTY ANTERIOR APPROACH (Right)  Patient Location: PACU  Anesthesia Type: Spinal  Level of Consciousness: sedated, patient cooperative and responds to stimulation  Airway & Oxygen Therapy: Patient Spontanous Breathing and Patient connected to face mask oxgen  Post-op Assessment: Report given to PACU RN and Post -op Vital signs reviewed and stable  Post vital signs: Reviewed and stable  Complications: No apparent anesthesia complications. T-12 level on exam, denied pain on assessment.

## 2014-07-18 NOTE — Discharge Instructions (Signed)

## 2014-07-18 NOTE — Addendum Note (Signed)
Addendum  created 07/18/14 1047 by Paris LoreShannon M Aashish Hamm, CRNA   Modules edited: Anesthesia Attestations

## 2014-07-18 NOTE — Op Note (Signed)
NAME:  Alec EtienneMark Key                ACCOUNT NO.: [id number]      MEDICAL RECORD NO.: [id number]      [facility]      PHYSICIAN:  Durene RomansLIN,Leighton Luster D  DATE OF BIRTH:  01/03/1971     DATE OF PROCEDURE:  07/18/2014                                 OPERATIVE REPORT         PREOPERATIVE DIAGNOSIS: Right  hip avascular necrosis.      POSTOPERATIVE DIAGNOSIS:  Right hip avascular necrosis.      PROCEDURE:  Right total hip replacement through an anterior approach   utilizing DePuy THR system, component size 54mm pinnacle cup, a size 36 neutral   Ceramax ceramic liner, a size 7 Hi Tri Lock stem with a 36+5 delta ceramic   ball.      SURGEON:  Madlyn FrankelMatthew D. Charlann Boxerlin, M.D.      ASSISTANT:  Lanney GinsMatthew Babish, PA-C     ANESTHESIA:  Spinal.      SPECIMENS:  None.      COMPLICATIONS:  None.      BLOOD LOSS:  500 cc     DRAINS:  None.      INDICATION OF THE PROCEDURE:  Alec Key is a 44 y.o. male who had   presented to office for evaluation of right hip pain.  Radiographs revealed advanced avascular necrosis with femoral head collapse.  The patient had painful limited range.  The patient was failing to    respond to conservative measures, and at this point was ready   to proceed with more definitive measures.  The patient has noted progressive   degenerative changes in his hip, progressive problems and dysfunction   with regarding the hip prior to surgery.  Consent was obtained for   benefit of pain relief.  Specific risk of infection, DVT, component   failure, dislocation, need for revision surgery, as well discussion of   the anterior versus posterior approach were reviewed.  Consent was   obtained for benefit of anterior pain relief through an anterior   approach.      PROCEDURE IN DETAIL:  The patient was brought to operative theater.   Once adequate anesthesia, preoperative antibiotics, 2gm of Ancef, 1 gm of Tranexamic Acid, and 10mg  of Decadron administered.   The  patient was positioned supine on the OSI Hanna table.  Once adequate   padding of boney process was carried out, we had predraped out the hip, and  used fluoroscopy to confirm orientation of the pelvis and position.      The right hip was then prepped and draped from proximal iliac crest to   mid thigh with shower curtain technique.      Time-out was performed identifying the patient, planned procedure, and   extremity.     An incision was then made 2 cm distal and lateral to the   anterior superior iliac spine extending over the orientation of the   tensor fascia lata muscle and sharp dissection was carried down to the   fascia of the muscle and protractor placed in the soft tissues.      The fascia was then incised.  The muscle belly was identified and swept   laterally and retractor placed along the superior  neck.  Following   cauterization of the circumflex vessels and removing some pericapsular   fat, a second cobra retractor was placed on the inferior neck.  A third   retractor was placed on the anterior acetabulum after elevating the   anterior rectus.  A L-capsulotomy was along the line of the   superior neck to the trochanteric fossa, then extended proximally and   distally.  Tag sutures were placed and the retractors were then placed   intracapsular.  We then identified the trochanteric fossa and   orientation of my neck cut, confirmed this radiographically   and then made a neck osteotomy with the femur on traction.  The femoral   head was removed without difficulty or complication.  Traction was let   off and retractors were placed posterior and anterior around the   acetabulum.      The labrum and foveal tissue were debrided.  I began reaming with a 45mm   reamer and reamed up to 53mm reamer with good bony bed preparation and a 54mm   cup was chosen.  The final 54mm Pinnacle cup was then impacted under fluoroscopy  to confirm the depth of penetration and orientation with  respect to   abduction.  A screw was placed followed by the hole eliminator.  The final   36mm Ceramax ceramic liner was impacted with good visualized rim fit.  The cup was positioned anatomically within the acetabular portion of the pelvis.      At this point, the femur was rolled at 80 degrees.  Further capsule was   released off the inferior aspect of the femoral neck.  I then   released the superior capsule proximally.  The hook was placed laterally   along the femur and elevated manually and held in position with the bed   hook.  The leg was then extended and adducted with the leg rolled to 100   degrees of external rotation.  Once the proximal femur was fully   exposed, I used a box osteotome to set orientation.  I then began   broaching with the starting chili pepper broach and passed this by hand and then broached up to 7.  With the 7 broach in place I chose a high offset neck and did a trial reduction.  The offset was appropriate, leg lengths   appeared to be equal best with the +5 head ball, confirmed radiographically.   Given these findings, I went ahead and dislocated the hip, repositioned all   retractors and positioned the right hip in the extended and abducted position.  The final 7 Hi Tri Lock stem was   chosen and it was impacted down to the level of neck cut.  Based on this   and the trial reduction, a 36+5 delta ceramic ball was chosen and   impacted onto a clean and dry trunnion, and the hip was reduced.  The   hip had been irrigated throughout the case again at this point.  I did   reapproximate the superior capsular leaflet to the anterior leaflet   using #1 Vicryl.  The fascia of the   tensor fascia lata muscle was then reapproximated using #1 Vicryl and #0 V-lock sutures.  The   remaining wound was closed with 2-0 Vicryl and running 4-0 Monocryl.   The hip was cleaned, dried, and dressed sterilely using Dermabond and   Aquacel dressing.  He was then brought   to  recovery room in  stable condition tolerating the procedure well.    Lanney Gins, PA-C was present for the entirety of the case involved from   preoperative positioning, perioperative retractor management, general   facilitation of the case, as well as primary wound closure as assistant.            Madlyn Frankel Charlann Boxer, M.D.        07/18/2014 8:56 AM

## 2014-07-18 NOTE — Interval H&P Note (Signed)
History and Physical Interval Note:  07/18/2014 6:30 AM  Deno EtienneMark Coppens  has presented today for surgery, with the diagnosis of RIGHT HIP AVN  The various methods of treatment have been discussed with the patient and family. After consideration of risks, benefits and other options for treatment, the patient has consented to  Procedure(s): RIGHT TOTAL HIP ARTHROPLASTY ANTERIOR APPROACH (Right) as a surgical intervention .  The patient's history has been reviewed, patient examined, no change in status, stable for surgery.  I have reviewed the patient's chart and labs.  Questions were answered to the patient's satisfaction.     Shelda PalLIN,Kalijah Zeiss D

## 2014-07-18 NOTE — Anesthesia Postprocedure Evaluation (Signed)
  Anesthesia Post-op Note  Patient: Alec EtienneMark Key  Procedure(s) Performed: Procedure(s) (LRB): RIGHT TOTAL HIP ARTHROPLASTY ANTERIOR APPROACH (Right)  Patient Location: PACU  Anesthesia Type: Spinal  Level of Consciousness: awake and alert   Airway and Oxygen Therapy: Patient Spontanous Breathing  Post-op Pain: mild  Post-op Assessment: Post-op Vital signs reviewed, Patient's Cardiovascular Status Stable, Respiratory Function Stable, Patent Airway and No signs of Nausea or vomiting  Last Vitals:  Filed Vitals:   07/18/14 1015  BP: 103/66  Pulse: 55  Temp:   Resp: 12    Post-op Vital Signs: stable   Complications: No apparent anesthesia complications

## 2014-07-20 ENCOUNTER — Encounter (HOSPITAL_COMMUNITY): Payer: Self-pay | Admitting: Orthopedic Surgery

## 2015-09-14 ENCOUNTER — Encounter (HOSPITAL_COMMUNITY): Payer: Self-pay | Admitting: Emergency Medicine

## 2015-09-14 ENCOUNTER — Ambulatory Visit (HOSPITAL_COMMUNITY): Payer: Self-pay

## 2015-09-14 ENCOUNTER — Ambulatory Visit (HOSPITAL_COMMUNITY)
Admission: EM | Admit: 2015-09-14 | Discharge: 2015-09-14 | Disposition: A | Discharge status: Home / Self Care | Payer: BLUE CROSS/BLUE SHIELD | Attending: Family Medicine | Admitting: Family Medicine

## 2015-09-14 DIAGNOSIS — R918 Other nonspecific abnormal finding of lung field: Secondary | ICD-10-CM | POA: Diagnosis not present

## 2015-09-14 DIAGNOSIS — J189 Pneumonia, unspecified organism: Secondary | ICD-10-CM | POA: Insufficient documentation

## 2015-09-14 DIAGNOSIS — B370 Candidal stomatitis: Secondary | ICD-10-CM | POA: Insufficient documentation

## 2015-09-14 DIAGNOSIS — F17210 Nicotine dependence, cigarettes, uncomplicated: Secondary | ICD-10-CM | POA: Insufficient documentation

## 2015-09-14 DIAGNOSIS — J9801 Acute bronchospasm: Secondary | ICD-10-CM | POA: Diagnosis not present

## 2015-09-14 DIAGNOSIS — M1990 Unspecified osteoarthritis, unspecified site: Secondary | ICD-10-CM | POA: Insufficient documentation

## 2015-09-14 MED ORDER — IBUPROFEN 800 MG PO TABS
ORAL_TABLET | ORAL | Status: AC
  Filled 2015-09-14: qty 1

## 2015-09-14 MED ORDER — NYSTATIN 100000 UNIT/ML MT SUSP: OROMUCOSAL | Status: DC

## 2015-09-14 MED ORDER — IBUPROFEN 800 MG PO TABS
800.0000 mg | ORAL_TABLET | Freq: Once | ORAL | Status: AC
  Administered 2015-09-14: 800 mg via ORAL

## 2015-09-14 MED ORDER — MOXIFLOXACIN HCL 400 MG PO TABS: 400.0000 mg | ORAL_TABLET | Freq: Every day | ORAL | Status: DC

## 2015-09-14 MED ORDER — TRIAMCINOLONE ACETONIDE 40 MG/ML IJ SUSP
60.0000 mg | Freq: Once | INTRAMUSCULAR | Status: AC
Start: 2015-09-14 — End: 2015-09-14
  Administered 2015-09-14: 60 mg via INTRAMUSCULAR

## 2015-09-14 MED ORDER — ALBUTEROL SULFATE (2.5 MG/3ML) 0.083% IN NEBU
INHALATION_SOLUTION | RESPIRATORY_TRACT | Status: AC
  Filled 2015-09-14: qty 3

## 2015-09-14 MED ORDER — PREDNISONE 20 MG PO TABS: ORAL_TABLET | ORAL | Status: DC

## 2015-09-14 MED ORDER — ALBUTEROL SULFATE HFA 108 (90 BASE) MCG/ACT IN AERS: 2.0000 | INHALATION_SPRAY | RESPIRATORY_TRACT | Status: DC | PRN

## 2015-09-14 MED ORDER — IPRATROPIUM-ALBUTEROL 0.5-2.5 (3) MG/3ML IN SOLN
3.0000 mL | Freq: Once | RESPIRATORY_TRACT | Status: AC
  Administered 2015-09-14: 3 mL via RESPIRATORY_TRACT

## 2015-09-14 MED ORDER — TRIAMCINOLONE ACETONIDE 40 MG/ML IJ SUSP
INTRAMUSCULAR | Status: AC
  Filled 2015-09-14: qty 1

## 2015-09-14 MED ORDER — ALBUTEROL SULFATE (2.5 MG/3ML) 0.083% IN NEBU
2.5000 mg | INHALATION_SOLUTION | Freq: Once | RESPIRATORY_TRACT | Status: AC
  Administered 2015-09-14: 2.5 mg via RESPIRATORY_TRACT

## 2015-09-14 MED ORDER — IPRATROPIUM-ALBUTEROL 0.5-2.5 (3) MG/3ML IN SOLN
RESPIRATORY_TRACT | Status: AC
  Filled 2015-09-14: qty 3

## 2015-09-14 NOTE — Discharge Instructions (Signed)
Bronchospasm, Adult A bronchospasm is when the tubes that carry air in and out of your lungs (airways) spasm or tighten. During a bronchospasm it is hard to breathe. This is because the airways get smaller. A bronchospasm can be triggered by:  Allergies. These may be to animals, pollen, food, or mold.  Infection. This is a common cause of bronchospasm.  Exercise.  Irritants. These include pollution, cigarette smoke, strong odors, aerosol sprays, and paint fumes.  Weather changes.  Stress.  Being emotional. HOME CARE   Always have a plan for getting help. Know when to call your doctor and local emergency services (911 in the [place] Know where you can get emergency care.  Only take medicines as told by your doctor.  If you were prescribed an inhaler or nebulizer machine, ask your doctor how to use it correctly. Always use a spacer with your inhaler if you were given one.  Stay calm during an attack. Try to relax and breathe more slowly.  Control your home environment:  Change your heating and air conditioning filter at least once a month.  Limit your use of fireplaces and wood stoves.  Do not  smoke. Do not  allow smoking in your home.  Avoid perfumes and fragrances.  Get rid of pests (such as roaches and mice) and their droppings.  Throw away plants if you see mold on them.  Keep your house clean and dust free.  Replace carpet with wood, tile, or vinyl flooring. Carpet can trap dander and dust.  Use allergy-proof pillows, mattress covers, and box spring covers.  Wash bed sheets and blankets every week in hot water. Dry them in a dryer.  Use blankets that are made of polyester or cotton.  Wash hands frequently. GET HELP IF:  You have muscle aches.  You have chest pain.  The thick spit you spit or cough up (sputum) changes from clear or white to yellow, green, gray, or bloody.  The thick spit you spit or cough up gets thicker.  There are problems that may be  related to the medicine you are given such as:  A rash.  Itching.  Swelling.  Trouble breathing. GET HELP RIGHT AWAY IF:  You feel you cannot breathe or catch your breath.  You cannot stop coughing.  Your treatment is not helping you breathe better.  You have very bad chest pain. MAKE SURE YOU:   Understand these instructions.  Will watch your condition.  Will get help right away if you are not doing well or get worse.   This information is not intended to replace advice given to you by your health care provider. Make sure you discuss any questions you have with your health care provider.   Document Released: 01/19/2009 Document Revised: 04/14/2014 Document Reviewed: 09/14/2012 Elsevier Interactive Patient Education 2016 Elsevier Inc.  Community-Acquired Pneumonia, Adult Pneumonia is an infection of the lungs. One type of pneumonia can happen while a person is in a hospital. A different type can happen when a person is not in a hospital (community-acquired pneumonia). It is easy for this kind to spread from person to person. It can spread to you if you breathe near an infected person who coughs or sneezes. Some symptoms include:  A dry cough.  A wet (productive) cough.  Fever.  Sweating.  Chest pain. HOME CARE  Take over-the-counter and prescription medicines only as told by your doctor.  Only take cough medicine if you are losing sleep.  If you were  prescribed an antibiotic medicine, take it as told by your doctor. Do not stop taking the antibiotic even if you start to feel better.  Sleep with your head and neck raised (elevated). You can do this by putting a few pillows under your head, or you can sleep in a recliner.  Do not use tobacco products. These include cigarettes, chewing tobacco, and e-cigarettes. If you need help quitting, ask your doctor.  Drink enough water to keep your pee (urine) clear or pale yellow. A shot (vaccine) can help prevent  pneumonia. Shots are often suggested for:  People older than 44 years of age.  People older than 44 years of age:  Who are having cancer treatment.  Who have long-term (chronic) lung disease.  Who have problems with their body's defense system (immune system). You may also prevent pneumonia if you take these actions:  Get the flu (influenza) shot every year.  Go to the dentist as often as told.  Wash your hands often. If soap and water are not available, use hand sanitizer. GET HELP IF:  You have a fever.  You lose sleep because your cough medicine does not help. GET HELP RIGHT AWAY IF:  You are short of breath and it gets worse.  You have more chest pain.  Your sickness gets worse. This is very serious if:  You are an older adult.  Your body's defense system is weak.  You cough up blood.   This information is not intended to replace advice given to you by your health care provider. Make sure you discuss any questions you have with your health care provider.   Document Released: 09/10/2007 Document Revised: 12/13/2014 Document Reviewed: 07/19/2014 Elsevier Interactive Patient Education 2016 [employer].  How to Use an Inhaler Using your inhaler correctly is very important. Good technique will make sure that the medicine reaches your lungs.  HOW TO USE AN INHALER:  Take the cap off the inhaler.  If this is the first time using your inhaler, you need to prime it. Shake the inhaler for 5 seconds. Release four puffs into the air, away from your face. Ask your doctor for help if you have questions.  Shake the inhaler for 5 seconds.  Turn the inhaler so the bottle is above the mouthpiece.  Put your pointer finger on top of the bottle. Your thumb holds the bottom of the inhaler.  Open your mouth.  Either hold the inhaler away from your mouth (the width of 2 fingers) or place your lips tightly around the mouthpiece. Ask your doctor which way to use your  inhaler.  Breathe out as much air as possible.  Breathe in and push down on the bottle 1 time to release the medicine. You will feel the medicine go in your mouth and throat.  Continue to take a deep breath in very slowly. Try to fill your lungs.  After you have breathed in completely, hold your breath for 10 seconds. This will help the medicine to settle in your lungs. If you cannot hold your breath for 10 seconds, hold it for as long as you can before you breathe out.  Breathe out slowly, through pursed lips. Whistling is an example of pursed lips.  If your doctor has told you to take more than 1 puff, wait at least 15-30 seconds between puffs. This will help you get the best results from your medicine. Do not use the inhaler more than your doctor tells you to.  Put the  cap back on the inhaler.  Follow the directions from your doctor or from the inhaler package about cleaning the inhaler. If you use more than one inhaler, ask your doctor which inhalers to use and what order to use them in. Ask your doctor to help you figure out when you will need to refill your inhaler.  If you use a steroid inhaler, always rinse your mouth with water after your last puff, gargle and spit out the water. Do not swallow the water. GET HELP IF:  The inhaler medicine only partially helps to stop wheezing or shortness of breath.  You are having trouble using your inhaler.  You have some increase in thick spit (phlegm). GET HELP RIGHT AWAY IF:  The inhaler medicine does not help your wheezing or shortness of breath or you have tightness in your chest.  You have dizziness, headaches, or fast heart rate.  You have chills, fever, or night sweats.  You have a large increase of thick spit, or your thick spit is bloody. MAKE SURE YOU:   Understand these instructions.  Will watch your condition.  Will get help right away if you are not doing well or get worse.   This information is not intended to  replace advice given to you by your health care provider. Make sure you discuss any questions you have with your health care provider.   Document Released: 01/01/2008 Document Revised: 01/12/2013 Document Reviewed: 10/21/2012 Elsevier Interactive Patient Education 2016 [employer]  Russell Springshrush, Adult Ginette Pitmanhrush, also called oral candidiasis, is a fungal infection that develops in the mouth and throat and on the tongue. It causes white patches to form on the mouth and tongue. Ginette Pitmanhrush is most common in older adults, but it can occur at any age.  Many cases of thrush are mild, but this infection can also be more serious. Ginette Pitmanhrush can be a recurring problem for people who have chronic illnesses or who take medicines that limit the body's ability to fight infection. Because these people have difficulty fighting infections, the fungus that causes thrush can spread throughout the body. This can cause life-threatening blood or organ infections. CAUSES  Ginette Pitmanhrush is usually caused by a yeast called Candida albicans. This fungus is normally present in small amounts in the mouth and on other mucous membranes. It usually causes no harm. However, when conditions are present that allow the fungus to grow uncontrolled, it invades surrounding tissues and becomes an infection. Less often, other Candida species can also lead to thrush.  RISK FACTORS Ginette Pitmanhrush is more likely to develop in the following people:  People with an impaired ability to fight infection (weakened immune system).   Older adults.   People with HIV.   People with diabetes.   People with dry mouth (xerostomia).   Pregnant women.   People with poor dental care, especially those who have false teeth.   People who use antibiotic medicines.  SIGNS AND SYMPTOMS  Ginette Pitmanhrush can be a mild infection that causes no symptoms. If symptoms develop, they may include:   A burning feeling in the mouth and throat. This can occur at the start of a thrush  infection.   White patches that adhere to the mouth and tongue. The tissue around the patches may be red, raw, and painful. If rubbed (during tooth brushing, for example), the patches and the tissue of the mouth may bleed easily.   A bad taste in the mouth or difficulty tasting foods.   Cottony feeling in the  mouth.   Pain during eating and swallowing. DIAGNOSIS  Your health care provider can usually diagnose thrush by looking in your mouth and asking you questions about your health.  TREATMENT  Medicines that help prevent the growth of fungi (antifungals) are the standard treatment for thrush. These medicines are either applied directly to the affected area (topical) or swallowed (oral). The treatment will depend on the severity of the condition.  Mild Thrush Mild cases of thrush may clear up with the use of an antifungal mouth rinse or lozenges. Treatment usually lasts about 14 days.  Moderate to Severe Thrush  More severe thrush infections that have spread to the esophagus are treated with an oral antifungal medicine. A topical antifungal medicine may also be used.   For some severe infections, a treatment period longer than 14 days may be needed.   Oral antifungal medicines are almost never used during pregnancy because the fetus may be harmed. However, if a pregnant woman has a rare, severe thrush infection that has spread to her blood, oral antifungal medicines may be used. In this case, the risk of harm to the mother and fetus from the severe thrush infection may be greater than the risk posed by the use of antifungal medicines.  Persistent or Recurrent Thrush For cases of thrush that do not go away or keep coming back, treatment may involve the following:   Treatment may be needed twice as long as the symptoms last.   Treatment will include both oral and topical antifungal medicines.   People with weakened immune systems can take an antifungal medicine on a continuous  basis to prevent thrush infections.  It is important to treat conditions that make you more likely to get thrush, such as diabetes or HIV.  HOME CARE INSTRUCTIONS   Only take over-the-counter or prescription medicine as directed by your health care provider. Talk to your health care provider about an over-the-counter medicine called gentian violet, which kills bacteria and fungi.   Eat plain, unflavored yogurt as directed by your health care provider. Check the label to make sure the yogurt contains live cultures. This yogurt can help healthy bacteria grow in the mouth that can stop the growth of the fungus that causes thrush.   Try these measures to help reduce the discomfort of thrush:   Drink cold liquids such as water or iced tea.   Try flavored ice treats or frozen juices.   Eat foods that are easy to swallow, such as gelatin, ice cream, or custard.   If the patches in your mouth are painful, try drinking from a straw.   Rinse your mouth several times a day with a warm saltwater rinse. You can make the saltwater mixture with 1 tsp (6 g) of salt in 8 fl oz (0.2 L) of warm water.   If you wear dentures, remove the dentures before going to bed, brush them vigorously, and soak them in a cleaning solution as directed by your health care provider.   Women who are breastfeeding should clean their nipples with an antifungal medicine as directed by their health care provider. Dry the nipples after breastfeeding. Applying lanolin-containing body lotion may help relieve nipple soreness.  SEEK MEDICAL CARE IF:  Your symptoms are getting worse or are not improving within 7 days of starting treatment.   You have symptoms of spreading infection, such as white patches on the skin outside of the mouth.   You are nursing and you have redness, burning, or  pain in the nipples that is not relieved with treatment.  MAKE SURE YOU:  Understand these instructions.  Will watch your  condition.  Will get help right away if you are not doing well or get worse.   This information is not intended to replace advice given to you by your health care provider. Make sure you discuss any questions you have with your health care provider.   Document Released: 12/18/2003 Document Revised: 04/14/2014 Document Reviewed: 10/25/2012 Elsevier Interactive Patient Education [employer]

## 2015-09-14 NOTE — ED Notes (Signed)
C/o cold sx onset x5 days associated w/fevers, chills, diaphoresis, n/v, dizziness Taking OTC mucinex and alka seltzer w/no relief A&O x4... No acute distress.

## 2015-09-14 NOTE — ED Provider Notes (Signed)
CSN: [id number]     Arrival date & time 09/14/15  1944 History   First MD Initiated Contact with Patient 09/14/15 2022     Chief Complaint  Patient presents with  . URI   (Consider location/radiation/quality/duration/timing/severity/associated sxs/prior Treatment) HPI Comments: 45 year old male presents to the urgent care after 4 day history of fevers, chills, dizziness, cough, shortness of breath, fatigue and malaise. Earlier in the week he had some episodes of vomiting and diarrhea. This has since subsided. He smokes 2 packs per day.   Past Medical History  Diagnosis Date  . Arthritis   . History of kidney stones   . Difficulty sleeping     DUE TO HIP PAIN  . Avascular necrosis of bone of right hip [facility])    Past Surgical History  Procedure Laterality Date  . Back surgery  june 2015    L4-L5 DISCECTOMY  . Total hip arthroplasty Right 07/18/2014    Procedure: RIGHT TOTAL HIP ARTHROPLASTY ANTERIOR APPROACH;  Surgeon: Durene Romans, MD;  Location: [facility];  Service: Orthopedics;  Laterality: Right;   History reviewed. No pertinent family history. Social History  Substance Use Topics  . Smoking status: Current Every Day Smoker -- 1.50 packs/day  . Smokeless tobacco: None  . Alcohol Use: Yes     Comment: 4-5 BEERS DAILY    Review of Systems  Constitutional: Positive for fever, chills, activity change and fatigue.  HENT: Positive for congestion. Negative for sore throat.   Eyes: Negative.   Respiratory: Positive for cough and shortness of breath.   Cardiovascular: Negative for chest pain.  Gastrointestinal: Negative.   Musculoskeletal: Negative.   Skin: Negative.   Neurological: Positive for dizziness and tremors. Negative for syncope and speech difficulty.    Allergies  Review of patient's allergies indicates no known allergies.  Home Medications   Prior to Admission medications   Medication Sig Start Date End Date Taking? Authorizing Provider  albuterol (PROVENTIL  HFA;VENTOLIN HFA) 108 (90 Base) MCG/ACT inhaler Inhale 2 puffs into the lungs every 4 (four) hours as needed for wheezing or shortness of breath. 09/14/15   Hayden Rasmussen, NP  celecoxib (CELEBREX) 200 MG capsule Take 1 capsule (200 mg total) by mouth 2 (two) times daily. 07/18/14   Lanney Gins, PA-C  HYDROcodone-acetaminophen (NORCO) 7.5-325 MG per tablet Take 1-2 tablets by mouth every 4 (four) hours as needed for moderate pain. 07/18/14   Lanney Gins, PA-C  methocarbamol (ROBAXIN) 500 MG tablet Take 1 tablet (500 mg total) by mouth every 6 (six) hours as needed for muscle spasms. 07/18/14   Lanney Gins, PA-C  moxifloxacin (AVELOX) 400 MG tablet Take 1 tablet (400 mg total) by mouth daily at 8 pm. 09/14/15   Hayden Rasmussen, NP  nystatin (MYCOSTATIN) 100000 UNIT/ML suspension Rinse, gargle and swallow 10 ml qid 09/14/15   Hayden Rasmussen, NP  predniSONE (DELTASONE) 20 MG tablet 3 Tabs PO Days 1-3, then 2 tabs PO Days 4-6, then 1 tab PO Day 7-9, then Half Tab PO Day 10-12 09/14/15   Hayden Rasmussen, NP   Meds Ordered and Administered this Visit   Medications  ipratropium-albuterol (DUONEB) 0.5-2.5 (3) MG/3ML nebulizer solution 3 mL (3 mLs Nebulization Given 09/14/15 2054)  albuterol (PROVENTIL) (2.5 MG/3ML) 0.083% nebulizer solution 2.5 mg (2.5 mg Nebulization Given 09/14/15 2054)  triamcinolone acetonide (KENALOG-40) injection 60 mg (60 mg Intramuscular Given 09/14/15 2054)  ibuprofen (ADVIL,MOTRIN) tablet 800 mg (800 mg Oral Given 09/14/15 2054)    BP 137/72 mmHg  Pulse  116  Temp(Src) 100 F (37.8 C) (Oral)  Resp 18  SpO2 94% No data found.   Physical Exam  Constitutional: He is oriented to person, place, and time. He appears well-developed and well-nourished. No distress.  HENT:  Bilateral TMs are normal Oropharynx with minor erythema. No exudates. Soft palate, arch and parts of tongue coated with white specks consistent with thrush.  Eyes: Conjunctivae and EOM are normal.  Neck: Normal range of motion. Neck  supple.  Cardiovascular: Regular rhythm and normal heart sounds.   Tachycardia  Pulmonary/Chest:  Increased respiratory effort. Inspiratory and expiratory wheezing, rhonchi coarseness. Prolonged expiratory phase.  Musculoskeletal: He exhibits no edema.  Lymphadenopathy:    He has no cervical adenopathy.  Neurological: He is alert and oriented to person, place, and time. No cranial nerve deficit. He exhibits normal muscle tone.  Skin: Skin is warm and dry.  Psychiatric: He has a normal mood and affect.  Nursing note and vitals reviewed.   ED Course  Procedures (including critical care time)  Labs Review Labs Reviewed - No data to display  Imaging Review Dg Chest 2 View  09/14/2015  CLINICAL DATA:  Fever, dyspnea, bronchospasm, smoker EXAM: CHEST  2 VIEW COMPARISON:  None. FINDINGS: Cardiomediastinal silhouette is unremarkable. There is nodular infiltrate with air bronchogram in left perihilar region. Bilateral lower lobe nodular infiltrates with air bronchogram. Findings highly suspicious for multifocal pneumonia. IMPRESSION: Nodular infiltrates bilateral lower lobe. Nodular infiltrate with air bronchogram left perihilar region. Findings highly suspicious for bilateral multifocal pneumonia . Followup PA and lateral chest X-ray is recommended in 3-4 weeks following trial of antibiotic therapy to ensure resolution and exclude underlying malignancy. Electronically Signed   By: Natasha Mead M.D.   On: 09/14/2015 21:38     Visual Acuity Review  Right Eye Distance:   Left Eye Distance:   Bilateral Distance:    Right Eye Near:   Left Eye Near:    Bilateral Near:         MDM   1. CAP (community acquired pneumonia)   2. Bronchospasm   3. Bilateral pulmonary infiltrates on CXR   4. Thrush, oral     Status post DuoNeb patient states he is breathing much better. Auscultation reveals substantial improvement in air movement and decrease in wheezing. Few scattered crackles and end  expiratory wheeze. Sent to ED for CXR and waiting on results after neb completed. Meds ordered this encounter  Medications  . ipratropium-albuterol (DUONEB) 0.5-2.5 (3) MG/3ML nebulizer solution 3 mL    Sig:   . albuterol (PROVENTIL) (2.5 MG/3ML) 0.083% nebulizer solution 2.5 mg    Sig:   . triamcinolone acetonide (KENALOG-40) injection 60 mg    Sig:   . ibuprofen (ADVIL,MOTRIN) tablet 800 mg    Sig:   . albuterol (PROVENTIL HFA;VENTOLIN HFA) 108 (90 Base) MCG/ACT inhaler    Sig: Inhale 2 puffs into the lungs every 4 (four) hours as needed for wheezing or shortness of breath.    Dispense:  1 Inhaler    Refill:  0    Order Specific Question:  Supervising Provider    Answer:  Bradd Canary D [id number]  . predniSONE (DELTASONE) 20 MG tablet    Sig: 3 Tabs PO Days 1-3, then 2 tabs PO Days 4-6, then 1 tab PO Day 7-9, then Half Tab PO Day 10-12    Dispense:  20 tablet    Refill:  0    Order Specific Question:  Supervising Provider  Answer:  Linna HoffKINDL, JAMES D [[id number]]  . moxifloxacin (AVELOX) 400 MG tablet    Sig: Take 1 tablet (400 mg total) by mouth daily at 8 pm.    Dispense:  7 tablet    Refill:  0    Order Specific Question:  Supervising Provider    Answer:  Linna HoffKINDL, JAMES D [phone][[id number]]  . nystatin (MYCOSTATIN) 100000 UNIT/ML suspension    Sig: Rinse, gargle and swallow 10 ml qid    Dispense:  120 mL    Refill:  0    Order Specific Question:  Supervising Provider    Answer:  Bradd CanaryKINDL, JAMES D [[id number]]   Need to f/u with PCP 2 to3 weeks, sooner if possible . If worse go to the ED. Need repeat CXR 3 weeks.   Hayden Rasmussenavid Nasif Bos, NP 09/14/15 2153  Hayden Rasmussenavid Ahmira Boisselle, NP 09/14/15 2201

## 2015-09-14 NOTE — ED Notes (Signed)
Patient transported to X-ray -- report given to DIRECTVJennifer

## 2015-09-28 ENCOUNTER — Other Ambulatory Visit: Payer: Self-pay | Admitting: Family Medicine

## 2015-09-28 ENCOUNTER — Ambulatory Visit
Admission: RE | Admit: 2015-09-28 | Discharge: 2015-09-28 | Disposition: A | Discharge status: Home / Self Care | Payer: Self-pay | Source: Ambulatory Visit | Attending: Family Medicine | Admitting: Family Medicine

## 2015-09-28 DIAGNOSIS — J189 Pneumonia, unspecified organism: Secondary | ICD-10-CM

## 2015-09-28 DIAGNOSIS — J181 Lobar pneumonia, unspecified organism: Principal | ICD-10-CM

## 2015-10-12 ENCOUNTER — Other Ambulatory Visit: Payer: Self-pay | Admitting: Family Medicine

## 2015-10-12 ENCOUNTER — Ambulatory Visit
Admission: RE | Admit: 2015-10-12 | Discharge: 2015-10-12 | Disposition: A | Discharge status: Home / Self Care | Payer: BLUE CROSS/BLUE SHIELD | Source: Ambulatory Visit | Attending: Family Medicine | Admitting: Family Medicine

## 2015-10-12 DIAGNOSIS — J181 Lobar pneumonia, unspecified organism: Secondary | ICD-10-CM

## 2015-11-02 ENCOUNTER — Ambulatory Visit
Admission: RE | Admit: 2015-11-02 | Discharge: 2015-11-02 | Disposition: A | Discharge status: Home / Self Care | Payer: BLUE CROSS/BLUE SHIELD | Source: Ambulatory Visit | Attending: Family Medicine | Admitting: Family Medicine

## 2015-11-02 ENCOUNTER — Other Ambulatory Visit: Payer: Self-pay | Admitting: Family Medicine

## 2015-11-02 DIAGNOSIS — J189 Pneumonia, unspecified organism: Secondary | ICD-10-CM

## 2015-11-07 ENCOUNTER — Other Ambulatory Visit: Payer: Self-pay | Admitting: Family Medicine

## 2015-11-07 DIAGNOSIS — J690 Pneumonitis due to inhalation of food and vomit: Secondary | ICD-10-CM

## 2015-11-30 ENCOUNTER — Ambulatory Visit
Admission: RE | Admit: 2015-11-30 | Discharge: 2015-11-30 | Disposition: A | Discharge status: Home / Self Care | Payer: BLUE CROSS/BLUE SHIELD | Source: Ambulatory Visit | Attending: Family Medicine | Admitting: Family Medicine

## 2015-11-30 ENCOUNTER — Other Ambulatory Visit: Payer: Self-pay | Admitting: Family Medicine

## 2015-11-30 DIAGNOSIS — J690 Pneumonitis due to inhalation of food and vomit: Secondary | ICD-10-CM

## 2015-11-30 DIAGNOSIS — J189 Pneumonia, unspecified organism: Secondary | ICD-10-CM

## 2017-07-04 IMAGING — DX DG CHEST 2V
2 series · 2 of 2 positions shown · non-contrast
Comparison: None.

CLINICAL DATA: Fever, dyspnea, bronchospasm, smoker

EXAM:
CHEST  2 VIEW

[chest pa]
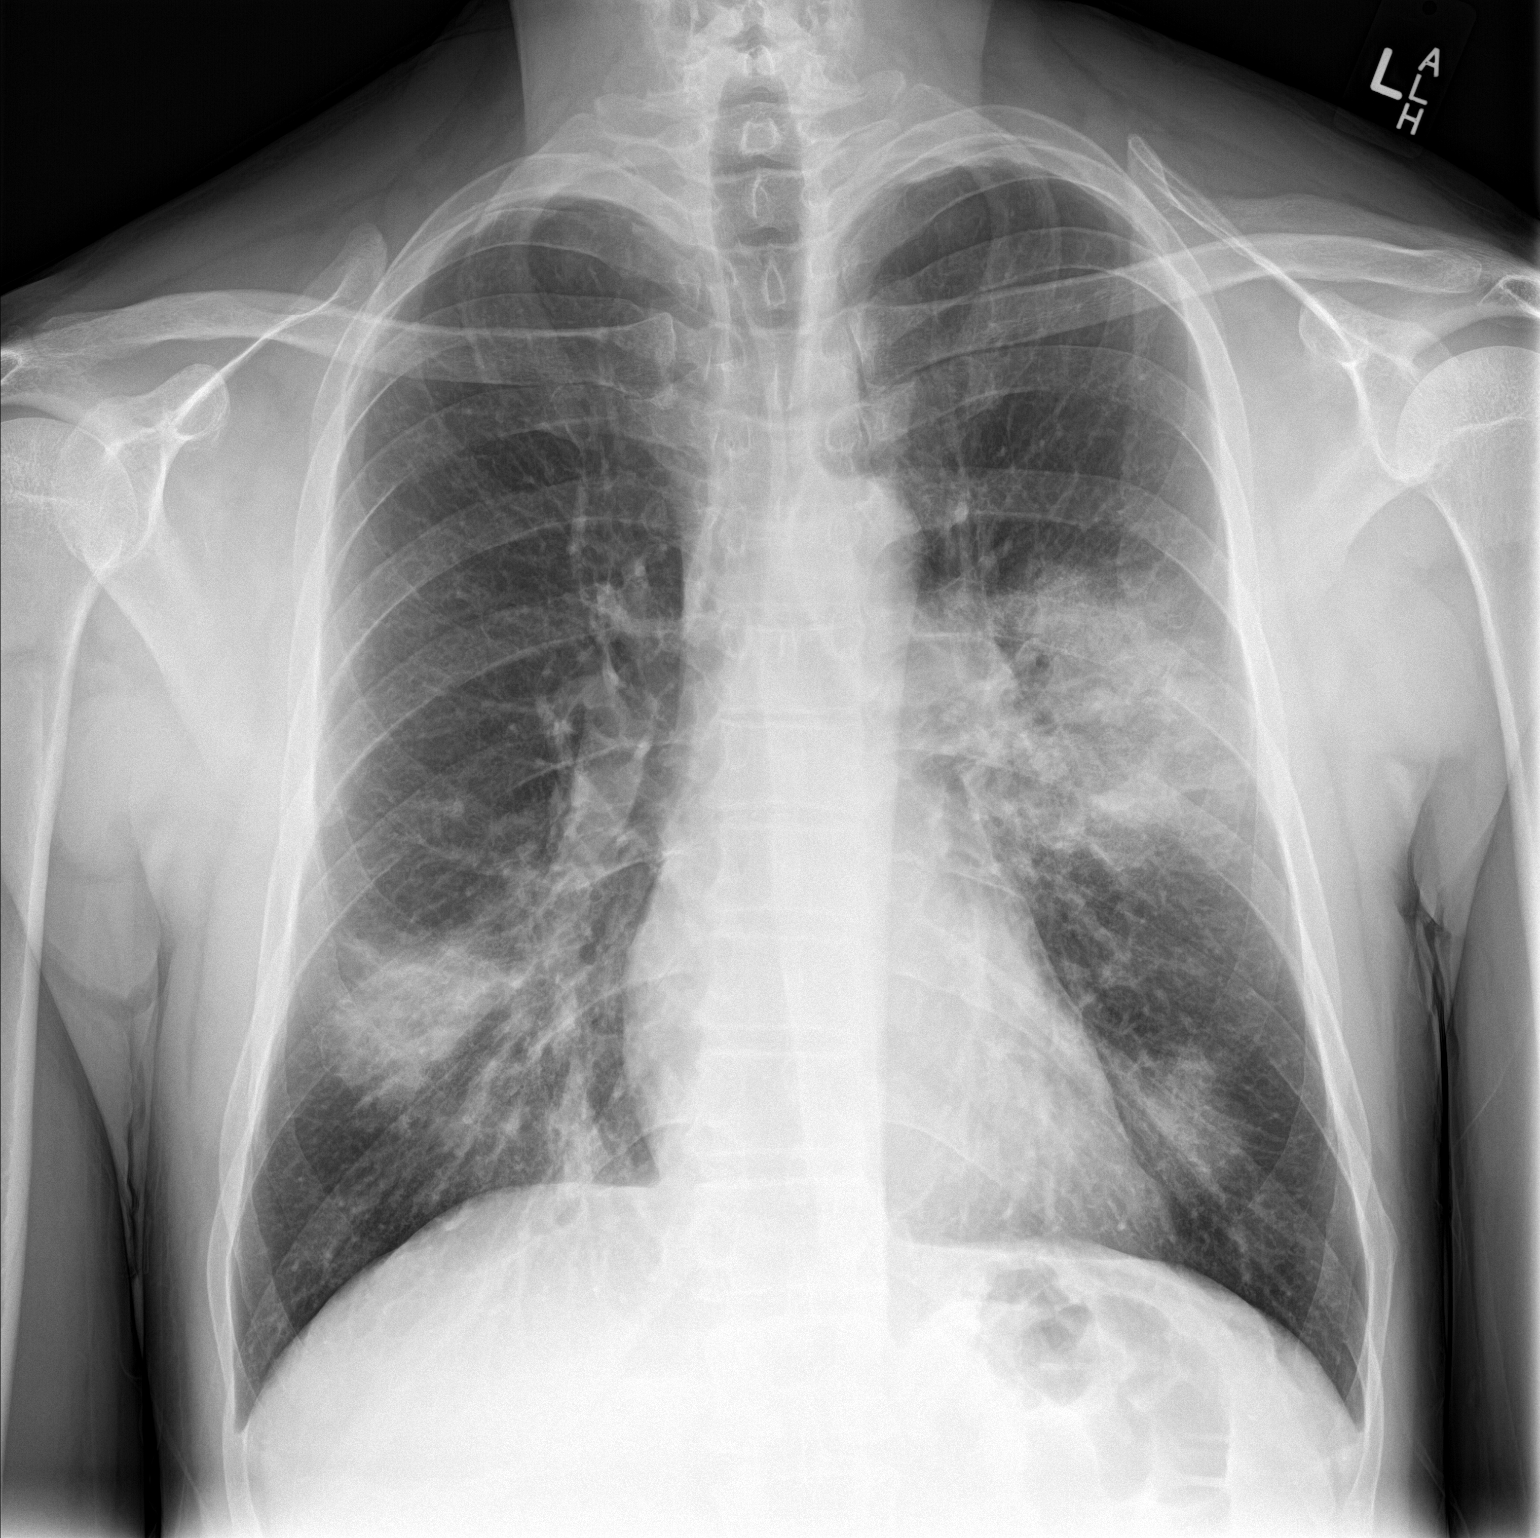

[chest lat]
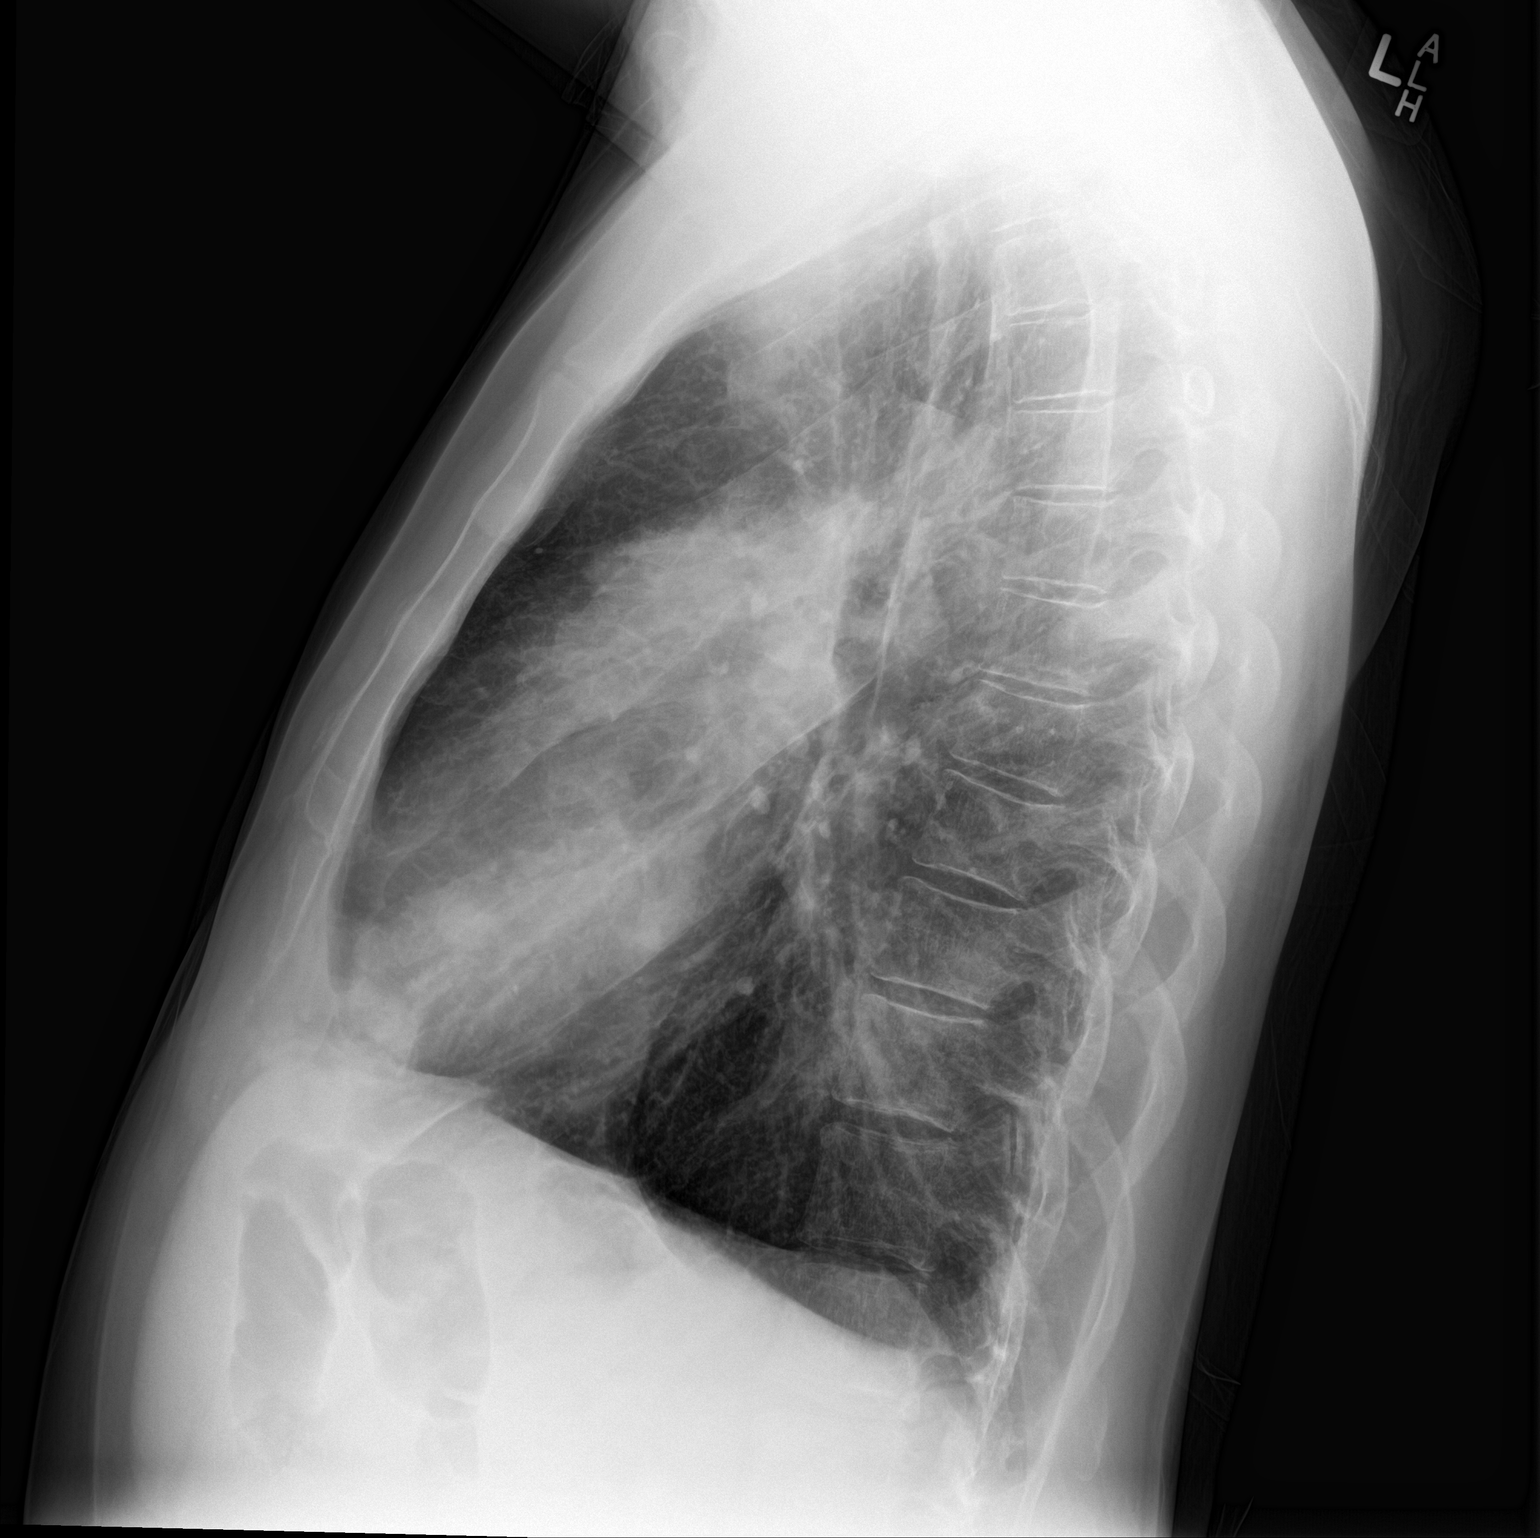

[2 of 2 positions shown; findings below may reference images not displayed]

FINDINGS: Cardiomediastinal silhouette is unremarkable. There is nodular
infiltrate with air bronchogram in left perihilar region. Bilateral
lower lobe nodular infiltrates with air bronchogram. Findings highly
suspicious for multifocal pneumonia.
IMPRESSION: Nodular infiltrates bilateral lower lobe. Nodular infiltrate with
air bronchogram left perihilar region. Findings highly suspicious
for bilateral multifocal pneumonia . Followup PA and lateral chest
X-ray is recommended in 3-4 weeks following trial of antibiotic
therapy to ensure resolution and exclude underlying malignancy.

## 2017-07-22 DIAGNOSIS — H40012 Open angle with borderline findings, low risk, left eye: Secondary | ICD-10-CM | POA: Diagnosis not present

## 2017-07-22 DIAGNOSIS — H17823 Peripheral opacity of cornea, bilateral: Secondary | ICD-10-CM | POA: Diagnosis not present

## 2018-02-06 DIAGNOSIS — S92001A Unspecified fracture of right calcaneus, initial encounter for closed fracture: Secondary | ICD-10-CM | POA: Diagnosis not present

## 2018-02-08 ENCOUNTER — Other Ambulatory Visit: Payer: Self-pay | Admitting: Orthopedic Surgery

## 2018-02-08 DIAGNOSIS — M79671 Pain in right foot: Secondary | ICD-10-CM

## 2018-02-08 DIAGNOSIS — S92001D Unspecified fracture of right calcaneus, subsequent encounter for fracture with routine healing: Secondary | ICD-10-CM | POA: Diagnosis not present

## 2018-02-10 ENCOUNTER — Ambulatory Visit
Admission: RE | Admit: 2018-02-10 | Discharge: 2018-02-10 | Disposition: A | Discharge status: Home / Self Care | Payer: BLUE CROSS/BLUE SHIELD | Source: Ambulatory Visit | Attending: Orthopedic Surgery | Admitting: Orthopedic Surgery

## 2018-02-10 DIAGNOSIS — M79671 Pain in right foot: Secondary | ICD-10-CM

## 2018-02-10 DIAGNOSIS — S92001A Unspecified fracture of right calcaneus, initial encounter for closed fracture: Secondary | ICD-10-CM | POA: Diagnosis not present

## 2018-02-15 DIAGNOSIS — R269 Unspecified abnormalities of gait and mobility: Secondary | ICD-10-CM | POA: Diagnosis not present

## 2018-02-15 DIAGNOSIS — S92001D Unspecified fracture of right calcaneus, subsequent encounter for fracture with routine healing: Secondary | ICD-10-CM | POA: Diagnosis not present

## 2018-02-22 DIAGNOSIS — S92001D Unspecified fracture of right calcaneus, subsequent encounter for fracture with routine healing: Secondary | ICD-10-CM | POA: Diagnosis not present

## 2018-03-17 DIAGNOSIS — R269 Unspecified abnormalities of gait and mobility: Secondary | ICD-10-CM | POA: Diagnosis not present

## 2018-03-22 DIAGNOSIS — S92001D Unspecified fracture of right calcaneus, subsequent encounter for fracture with routine healing: Secondary | ICD-10-CM | POA: Diagnosis not present

## 2018-04-17 DIAGNOSIS — R269 Unspecified abnormalities of gait and mobility: Secondary | ICD-10-CM | POA: Diagnosis not present

## 2018-04-19 DIAGNOSIS — S92001D Unspecified fracture of right calcaneus, subsequent encounter for fracture with routine healing: Secondary | ICD-10-CM | POA: Diagnosis not present

## 2018-05-11 DIAGNOSIS — G43009 Migraine without aura, not intractable, without status migrainosus: Secondary | ICD-10-CM | POA: Diagnosis not present

## 2018-05-11 DIAGNOSIS — Z91030 Bee allergy status: Secondary | ICD-10-CM | POA: Diagnosis not present

## 2018-05-11 DIAGNOSIS — F17210 Nicotine dependence, cigarettes, uncomplicated: Secondary | ICD-10-CM | POA: Diagnosis not present

## 2018-05-17 DIAGNOSIS — S92001D Unspecified fracture of right calcaneus, subsequent encounter for fracture with routine healing: Secondary | ICD-10-CM | POA: Diagnosis not present

## 2018-05-18 DIAGNOSIS — R269 Unspecified abnormalities of gait and mobility: Secondary | ICD-10-CM | POA: Diagnosis not present

## 2018-06-14 DIAGNOSIS — S92001D Unspecified fracture of right calcaneus, subsequent encounter for fracture with routine healing: Secondary | ICD-10-CM | POA: Diagnosis not present

## 2018-06-16 DIAGNOSIS — R269 Unspecified abnormalities of gait and mobility: Secondary | ICD-10-CM | POA: Diagnosis not present

## 2018-07-17 DIAGNOSIS — R269 Unspecified abnormalities of gait and mobility: Secondary | ICD-10-CM | POA: Diagnosis not present

## 2018-08-16 DIAGNOSIS — R269 Unspecified abnormalities of gait and mobility: Secondary | ICD-10-CM | POA: Diagnosis not present

## 2018-09-16 DIAGNOSIS — R269 Unspecified abnormalities of gait and mobility: Secondary | ICD-10-CM | POA: Diagnosis not present

## 2018-10-16 DIAGNOSIS — R269 Unspecified abnormalities of gait and mobility: Secondary | ICD-10-CM | POA: Diagnosis not present

## 2018-11-16 DIAGNOSIS — R269 Unspecified abnormalities of gait and mobility: Secondary | ICD-10-CM | POA: Diagnosis not present

## 2019-01-03 ENCOUNTER — Ambulatory Visit
Admission: EM | Admit: 2019-01-03 | Discharge: 2019-01-03 | Disposition: A | Discharge status: Home / Self Care | Payer: BC Managed Care – PPO | Attending: Physician Assistant | Admitting: Physician Assistant

## 2019-01-03 ENCOUNTER — Other Ambulatory Visit: Payer: Self-pay

## 2019-01-03 ENCOUNTER — Encounter: Payer: Self-pay | Admitting: Emergency Medicine

## 2019-01-03 DIAGNOSIS — M545 Low back pain, unspecified: Secondary | ICD-10-CM

## 2019-01-03 DIAGNOSIS — R200 Anesthesia of skin: Secondary | ICD-10-CM

## 2019-01-03 DIAGNOSIS — R202 Paresthesia of skin: Secondary | ICD-10-CM | POA: Diagnosis not present

## 2019-01-03 MED ORDER — PREDNISONE 50 MG PO TABS: 50.0000 mg | ORAL_TABLET | Freq: Every day | ORAL | 0 refills | Status: DC

## 2019-01-03 MED ORDER — METHOCARBAMOL 500 MG PO TABS: 500.0000 mg | ORAL_TABLET | Freq: Two times a day (BID) | ORAL | 0 refills | Status: DC

## 2019-01-03 MED ORDER — ALBUTEROL SULFATE HFA 108 (90 BASE) MCG/ACT IN AERS: 2.0000 | INHALATION_SPRAY | RESPIRATORY_TRACT | 0 refills | Status: DC | PRN

## 2019-01-03 NOTE — ED Provider Notes (Signed)
[facility]    CSN: [id number] Arrival date & time: 01/03/19  1520      History   Chief Complaint Chief Complaint  Patient presents with  . Back Pain  . arm tingling    HPI Alec Key is a 48 y.o. male.   48 year old male comes in for 3-day history of right arm numbness/tingling, right lower back pain.  Patient denies injury/trauma.  States right arm numbness/tingling is intermittent, can be triggered by movement.  States numbness/tingling travels down whole forearm and fingers.  Does have occasional right thoracic pain without any neck pain.  Denies loss of grip strength.  Taking aspirin without relief.  Patient also with right lower back pain that is constant, worse with movement, especially going from sitting to standing position.  Denies radiation of pain.  Denies saddle anesthesia, loss of bladder or bowel control.  Denies heavy lifting.  However, work requires physical activity.  He has a history of L4-L5 discectomy, right hip replacement due to AVN.  He denies having chronic back pain/hip pain post surgery.     Past Medical History:  Diagnosis Date  . Arthritis   . Avascular necrosis of bone of right hip (HCC)   . Difficulty sleeping    DUE TO HIP PAIN  . History of kidney stones     Patient Active Problem List   Diagnosis Date Noted  . S/P right THA, AA 07/18/2014    Past Surgical History:  Procedure Laterality Date  . BACK SURGERY  june 2015   L4-L5 DISCECTOMY  . TOTAL HIP ARTHROPLASTY Right 07/18/2014   Procedure: RIGHT TOTAL HIP ARTHROPLASTY ANTERIOR APPROACH;  Surgeon: Durene RomansMatthew Olin, MD;  Location: [facility];  Service: Orthopedics;  Laterality: Right;       Home Medications    Prior to Admission medications   Medication Sig Start Date End Date Taking? Authorizing Provider  albuterol (VENTOLIN HFA) 108 (90 Base) MCG/ACT inhaler Inhale 2 puffs into the lungs every 4 (four) hours as needed for wheezing or shortness of breath. 01/03/19   Cathie HoopsYu, Laterria Lasota  V, PA-C  methocarbamol (ROBAXIN) 500 MG tablet Take 1 tablet (500 mg total) by mouth 2 (two) times daily. 01/03/19   Cathie HoopsYu, Jasmaine Rochel V, PA-C  predniSONE (DELTASONE) 50 MG tablet Take 1 tablet (50 mg total) by mouth daily with breakfast. 01/03/19   Belinda FisherYu, Tahani Potier V, PA-C    Family History History reviewed. No pertinent family history.  Social History Social History   Tobacco Use  . Smoking status: Current Every Day Smoker    Packs/day: 1.50  . Smokeless tobacco: Never Used  Substance Use Topics  . Alcohol use: Yes    Comment: 4-5 BEERS DAILY  . Drug use: Yes    Types: Marijuana     Allergies   Patient has no known allergies.   Review of Systems Review of Systems  Reason unable to perform ROS: See HPI as above.     Physical Exam Triage Vital Signs ED Triage Vitals  Enc Vitals Group     BP 01/03/19 1532 120/75     Pulse Rate 01/03/19 1532 86     Resp 01/03/19 1532 16     Temp 01/03/19 1532 98.4 F (36.9 C)     Temp Source 01/03/19 1532 Oral     SpO2 01/03/19 1532 96 %     Weight --      Height --      Head Circumference --      Peak  Flow --      Pain Score 01/03/19 1535 7     Pain Loc --      Pain Edu? --      Excl. in [place]? --    No data found.  Updated Vital Signs BP 120/75 (BP Location: Left Arm)   Pulse 86   Temp 98.4 F (36.9 C) (Oral)   Resp 16   SpO2 96%   Physical Exam Constitutional:      General: He is not in acute distress.    Appearance: Normal appearance. He is well-developed. He is not ill-appearing, toxic-appearing or diaphoretic.  HENT:     Head: Normocephalic and atraumatic.  Eyes:     Conjunctiva/sclera: Conjunctivae normal.     Pupils: Pupils are equal, round, and reactive to light.  Neck:     Musculoskeletal: Normal range of motion and neck supple. No pain with movement, spinous process tenderness or muscular tenderness.  Cardiovascular:     Rate and Rhythm: Normal rate and regular rhythm.     Heart sounds: Normal heart sounds. No murmur. No  friction rub. No gallop.   Pulmonary:     Effort: Pulmonary effort is normal. No accessory muscle usage or respiratory distress.     Breath sounds: Normal breath sounds. No stridor. No decreased breath sounds, wheezing, rhonchi or rales.  Musculoskeletal:     Comments: No tenderness on palpation of the spinous processes.  Tenderness to palpation of right lumbar region.  Full range of motion of back and hip.  Strength normal and equal bilaterally.  Sensation intact ankle bilaterally.  Negative straight leg raise.   No obvious joint swelling, erythema, warmth to the upper extremities.  No tenderness to palpation of thoracic back, shoulder, elbow, wrist, fingers.  Full range of motion.  Strength normal equal bilaterally.  Normal grip strength.  Sensation intact ankle bilaterally.  Radial pulse 2+, cap refill less than 2 seconds.  Negative Tinel's, Phalen's.  Skin:    General: Skin is warm and dry.  Neurological:     Mental Status: He is alert and oriented to person, place, and time.      UC Treatments / Results  Labs (all labs ordered are listed, but only abnormal results are displayed) Labs Reviewed - No data to display  EKG   Radiology No results found.  Procedures Procedures (including critical care time)  Medications Ordered in UC Medications - No data to display  Initial Impression / Assessment and Plan / UC Course  I have reviewed the triage vital signs and the nursing notes.  Pertinent labs & imaging results that were available during my care of the patient were reviewed by me and considered in my medical decision making (see chart for details).    Will start patient on prednisone.  Muscle relaxant as needed. Discussed avoiding heavy lifting. Given patient's history, will have patient follow-up with orthopedics if symptoms do not improving.  Return precautions given.  Patient expresses understanding and agrees to plan.  Patient also requesting refill of albuterol at  discharge. Denies current symptoms, states uses albuterol PRN and has ran out.   Final Clinical Impressions(s) / UC Diagnoses   Final diagnoses:  Acute right-sided low back pain without sciatica  Numbness and tingling of right arm   ED Prescriptions    Medication Sig Dispense Auth. Provider   albuterol (VENTOLIN HFA) 108 (90 Base) MCG/ACT inhaler Inhale 2 puffs into the lungs every 4 (four) hours as needed for wheezing or shortness  of breath. 18 g Nickalus Thornsberry V, PA-C   predniSONE (DELTASONE) 50 MG tablet Take 1 tablet (50 mg total) by mouth daily with breakfast. 5 tablet Sable Knoles V, PA-C   methocarbamol (ROBAXIN) 500 MG tablet Take 1 tablet (500 mg total) by mouth 2 (two) times daily. 20 tablet Belinda Fisher, PA-C     PDMP not reviewed this encounter.   Belinda Fisher, PA-C 01/03/19 1615

## 2019-01-03 NOTE — ED Triage Notes (Signed)
Pt presents to [facility] for assessment of lower right sided back pain x 3.  Denies known injury.  Also states for 1 week he has been having intermittent right arm tingling which may last 20 minutes and then go away.  States tingling is occurring at this time.

## 2019-01-03 NOTE — ED Notes (Signed)
Patient able to ambulate independently  

## 2019-01-03 NOTE — Discharge Instructions (Signed)
Start prednisone as directed. Robaxin as needed, this can make you drowsy, so do not take if you are going to drive, operate heavy machinery, or make important decisions. Ice/heat compresses as needed. Follow up with orthopedics if symptoms not improving.

## 2019-02-15 DIAGNOSIS — M5030 Other cervical disc degeneration, unspecified cervical region: Secondary | ICD-10-CM | POA: Diagnosis not present

## 2019-02-15 DIAGNOSIS — M25511 Pain in right shoulder: Secondary | ICD-10-CM | POA: Diagnosis not present

## 2019-03-29 DIAGNOSIS — M5030 Other cervical disc degeneration, unspecified cervical region: Secondary | ICD-10-CM | POA: Diagnosis not present

## 2019-03-29 DIAGNOSIS — M542 Cervicalgia: Secondary | ICD-10-CM | POA: Diagnosis not present

## 2019-04-09 DIAGNOSIS — M5030 Other cervical disc degeneration, unspecified cervical region: Secondary | ICD-10-CM | POA: Diagnosis not present

## 2019-04-15 DIAGNOSIS — M5412 Radiculopathy, cervical region: Secondary | ICD-10-CM | POA: Diagnosis not present

## 2019-04-15 DIAGNOSIS — M5030 Other cervical disc degeneration, unspecified cervical region: Secondary | ICD-10-CM | POA: Diagnosis not present

## 2019-04-29 DIAGNOSIS — M5412 Radiculopathy, cervical region: Secondary | ICD-10-CM | POA: Diagnosis not present

## 2019-05-10 DIAGNOSIS — M5030 Other cervical disc degeneration, unspecified cervical region: Secondary | ICD-10-CM | POA: Diagnosis not present

## 2019-05-10 DIAGNOSIS — M5412 Radiculopathy, cervical region: Secondary | ICD-10-CM | POA: Diagnosis not present

## 2019-05-11 DIAGNOSIS — M5412 Radiculopathy, cervical region: Secondary | ICD-10-CM | POA: Diagnosis not present

## 2019-05-18 DIAGNOSIS — M5412 Radiculopathy, cervical region: Secondary | ICD-10-CM | POA: Diagnosis not present

## 2019-07-23 ENCOUNTER — Ambulatory Visit
Admission: EM | Admit: 2019-07-23 | Discharge: 2019-07-23 | Disposition: A | Discharge status: Home / Self Care | Payer: BC Managed Care – PPO | Attending: Physician Assistant | Admitting: Physician Assistant

## 2019-07-23 ENCOUNTER — Encounter: Payer: Self-pay | Admitting: *Deleted

## 2019-07-23 ENCOUNTER — Other Ambulatory Visit: Payer: Self-pay

## 2019-07-23 DIAGNOSIS — S0990XA Unspecified injury of head, initial encounter: Secondary | ICD-10-CM

## 2019-07-23 DIAGNOSIS — S0101XA Laceration without foreign body of scalp, initial encounter: Secondary | ICD-10-CM

## 2019-07-23 DIAGNOSIS — Z23 Encounter for immunization: Secondary | ICD-10-CM | POA: Diagnosis not present

## 2019-07-23 MED ORDER — TETANUS-DIPHTH-ACELL PERTUSSIS 5-2.5-18.5 LF-MCG/0.5 IM SUSP
0.5000 mL | Freq: Once | INTRAMUSCULAR | Status: AC
  Administered 2019-07-23: 10:00:00 0.5 mL via INTRAMUSCULAR

## 2019-07-23 MED ORDER — LIDOCAINE-EPINEPHRINE-TETRACAINE (LET) TOPICAL GEL
3.0000 mL | Freq: Once | TOPICAL | Status: AC
  Administered 2019-07-23: 3 mL via TOPICAL
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
## 2019-07-23 NOTE — Discharge Instructions (Signed)
Tetanus updated today. 6 staples placed. Avoid getting it wet for the first 24 hours. Keep wound clean and dry. You can clean gently with soap and water. Do not soak area in water. Monitor for spreading redness, increased warmth, increased swelling, fever, follow up for reevaluation needed. Otherwise follow up in 7 days for suture removal.   Head If experiencing worsening of symptoms, headache/blurry vision, nausea/vomiting, confusion/altered mental status, dizziness, weakness, passing out, imbalance, go to the emergency department for further evaluation.
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
## 2019-07-30 ENCOUNTER — Other Ambulatory Visit: Payer: Self-pay

## 2019-07-30 ENCOUNTER — Ambulatory Visit
Admission: EM | Admit: 2019-07-30 | Discharge: 2019-07-30 | Disposition: A | Discharge status: Home / Self Care | Payer: BC Managed Care – PPO | Attending: Emergency Medicine | Admitting: Emergency Medicine

## 2019-07-30 DIAGNOSIS — Z4802 Encounter for removal of sutures: Secondary | ICD-10-CM

## 2019-07-30 NOTE — ED Triage Notes (Signed)
Staple removal, seen 07/23/2019 for initial wound care.  Seen by brittany h, pa only at this visit

## 2020-01-02 ENCOUNTER — Other Ambulatory Visit: Payer: BC Managed Care – PPO

## 2020-01-02 DIAGNOSIS — Z20822 Contact with and (suspected) exposure to covid-19: Secondary | ICD-10-CM

## 2020-01-03 LAB — SARS-COV-2, NAA 2 DAY TAT

## 2020-01-03 LAB — NOVEL CORONAVIRUS, NAA: SARS-CoV-2, NAA: DETECTED — AB

## 2020-05-16 DIAGNOSIS — H401121 Primary open-angle glaucoma, left eye, mild stage: Secondary | ICD-10-CM | POA: Diagnosis not present

## 2020-05-16 DIAGNOSIS — H17823 Peripheral opacity of cornea, bilateral: Secondary | ICD-10-CM | POA: Diagnosis not present

## 2020-05-16 DIAGNOSIS — H40011 Open angle with borderline findings, low risk, right eye: Secondary | ICD-10-CM | POA: Diagnosis not present

## 2020-06-27 DIAGNOSIS — H17823 Peripheral opacity of cornea, bilateral: Secondary | ICD-10-CM | POA: Diagnosis not present

## 2020-06-27 DIAGNOSIS — H401121 Primary open-angle glaucoma, left eye, mild stage: Secondary | ICD-10-CM | POA: Diagnosis not present

## 2020-06-27 DIAGNOSIS — H40011 Open angle with borderline findings, low risk, right eye: Secondary | ICD-10-CM | POA: Diagnosis not present

## 2020-09-09 IMAGING — CT CT FOOT*R* W/O CM
3 of 4 series · 12 of 34 positions shown, 14 images · non-contrast
Comparison: None.

CLINICAL DATA: Evaluate calcaneus fracture. Fell off ladder
02/05/2018

EXAM:
CT OF THE RIGHT FOOT WITHOUT CONTRAST
TECHNIQUE: Multidetector CT imaging of the right foot was performed according
to the standard protocol. Multiplanar CT image reconstructions were
also generated.

[Series 9: sfov lower extremity 2.00 br40 s3 cor soft · coronal · 0.26mm/px · 3 of 149 slices shown]
[im 57/149  bone]
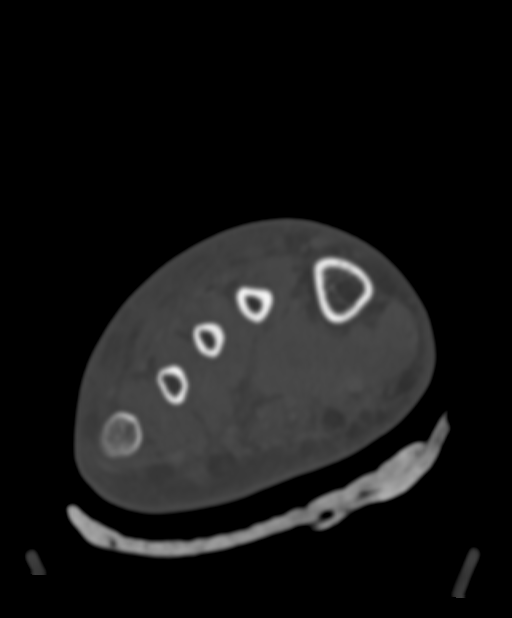
[im 69/149  bone]
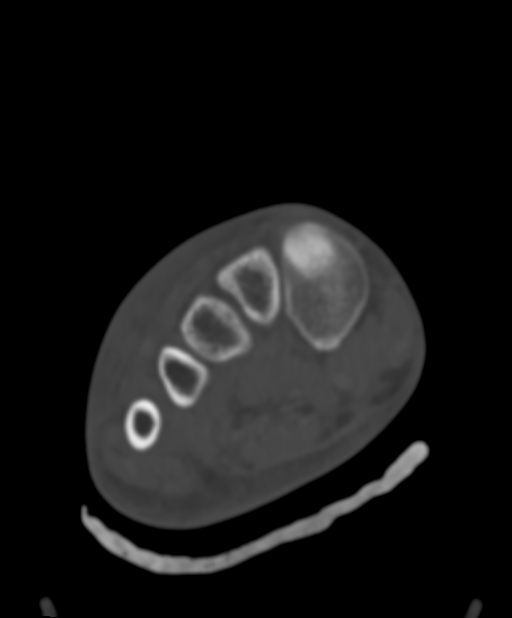
[im 81/149  bone]
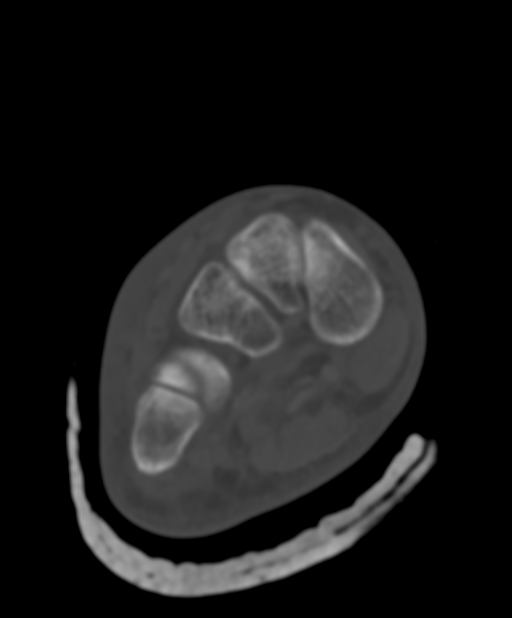

[Series 13: sfov lower extremity 2.00 br40 s3 sag soft · sagittal · 0.31mm/px · 5 of 66 slices shown, 6 images]
[im 22/66  bone]
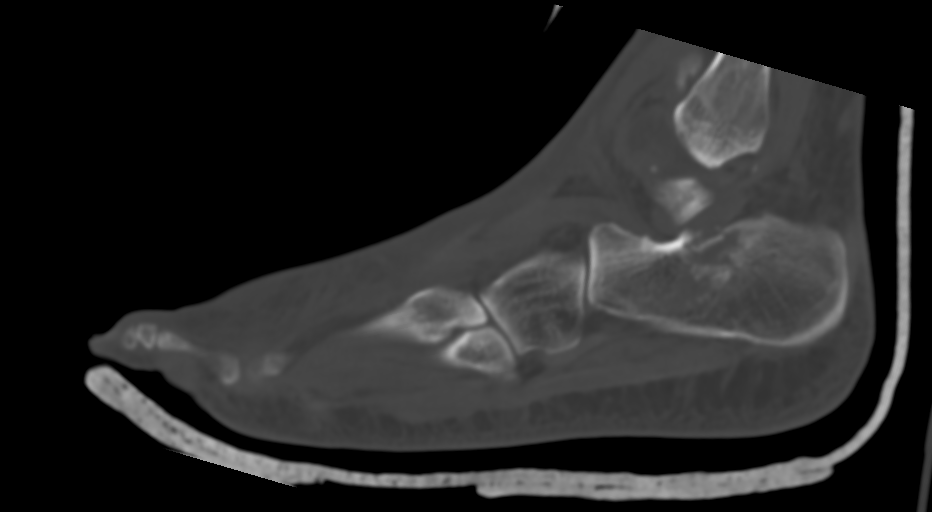
[im 28/66  bone]
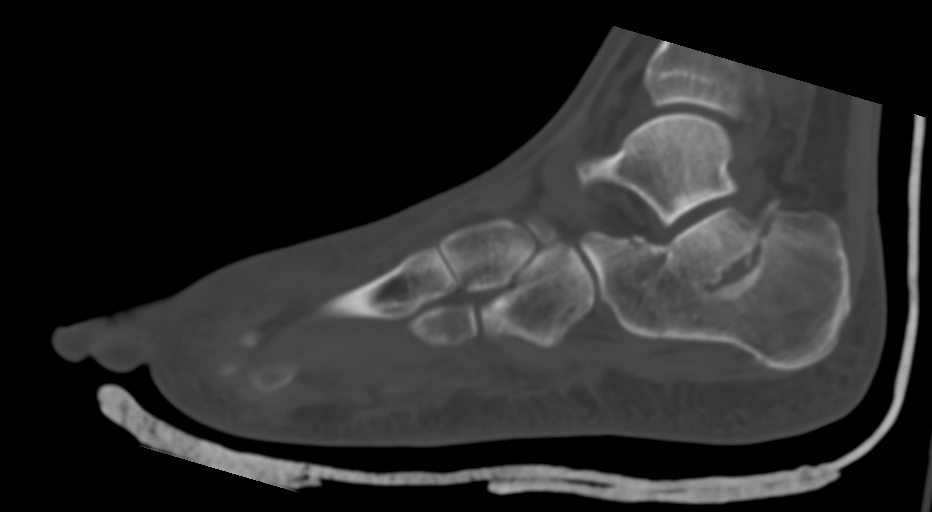
[im 33/66  soft-tissue]
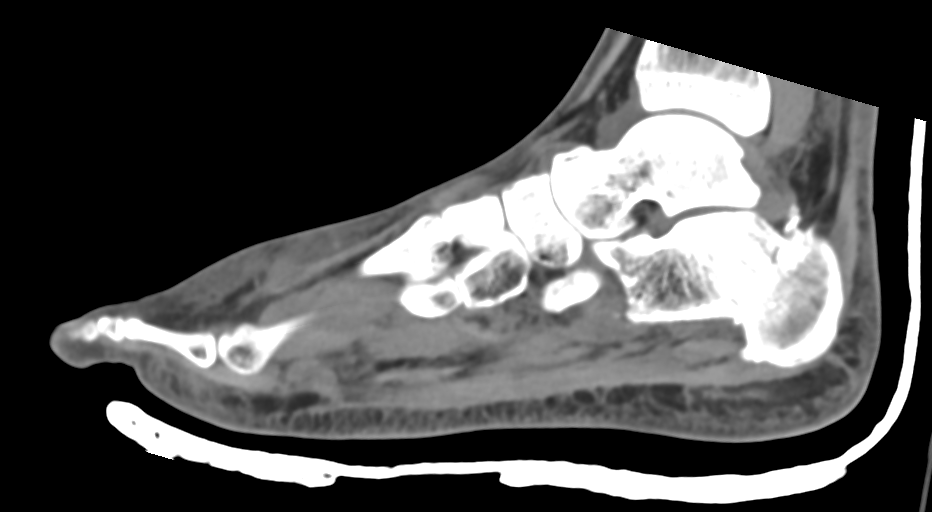
[im 33/66  bone]
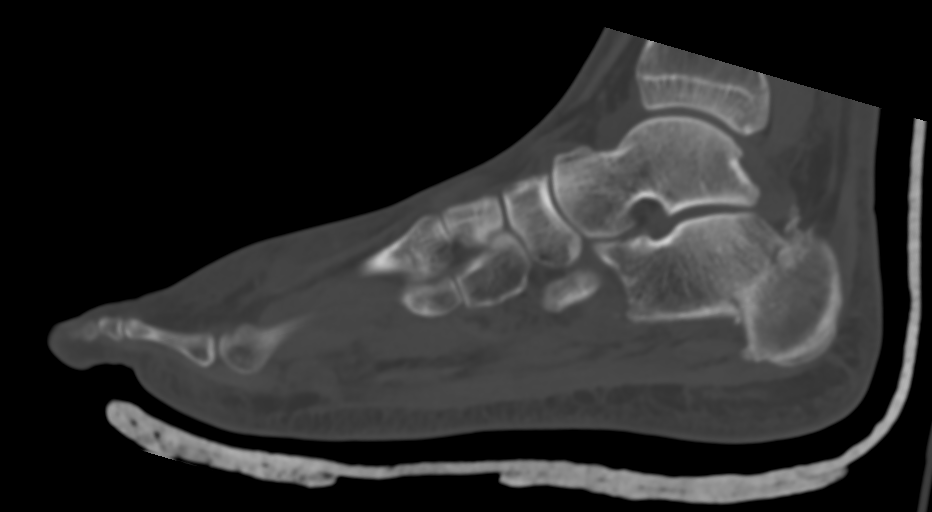
[im 38/66  bone]
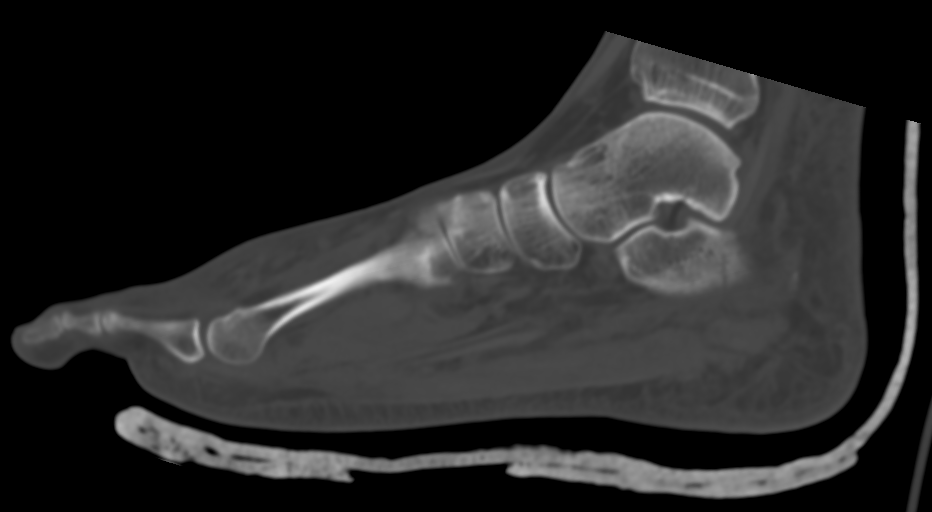
[im 44/66  bone]
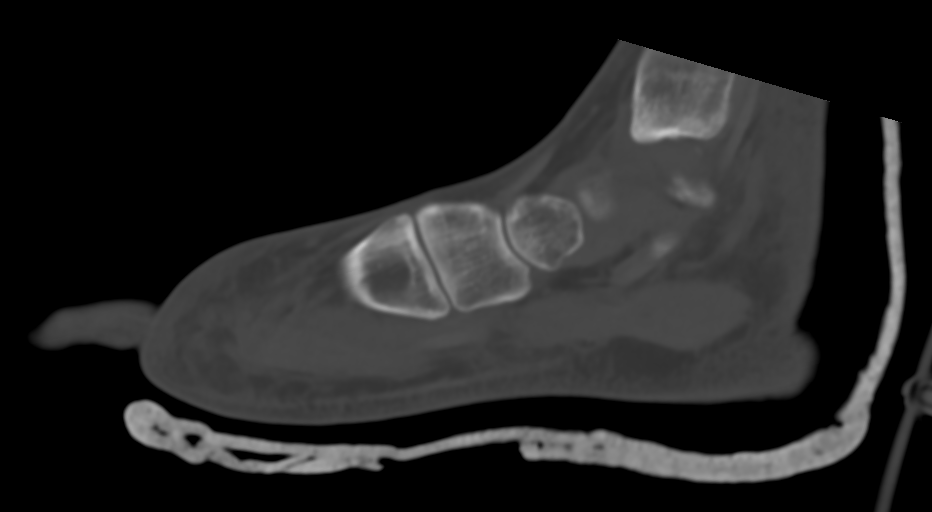

[Series 16: sfov lower extremity 0.60 br40 s3 soft thin · axial · 0.60mm/px · z∈[+473,+572]mm · 4 of 275 slices shown, 5 images]
[im 55/275  soft-tissue]
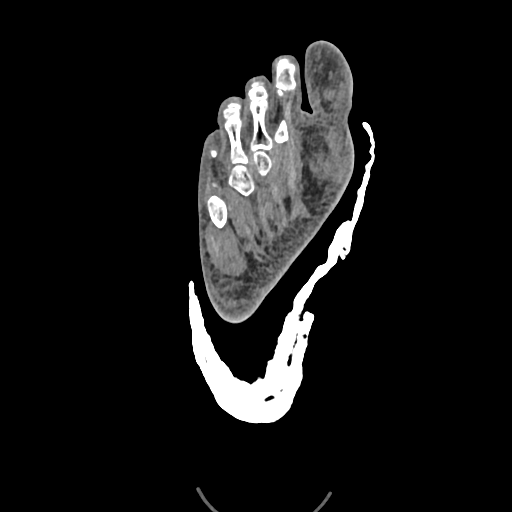
[im 55/275  bone]
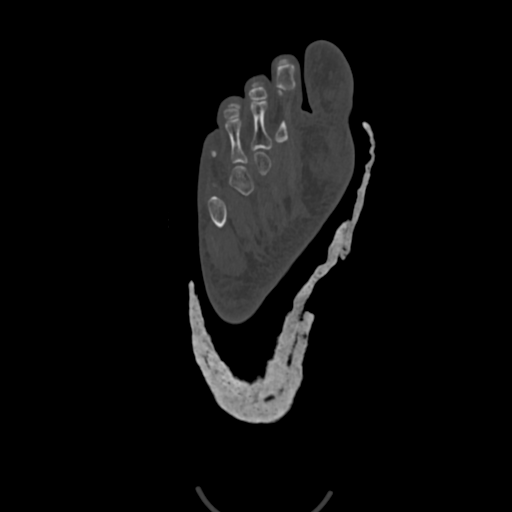
[im 110/275  bone]
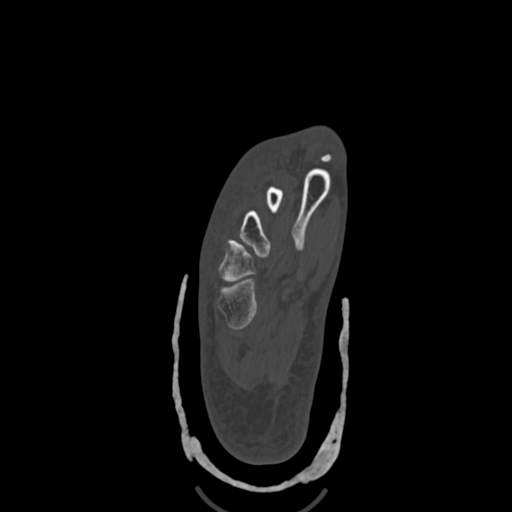
[im 165/275  bone]
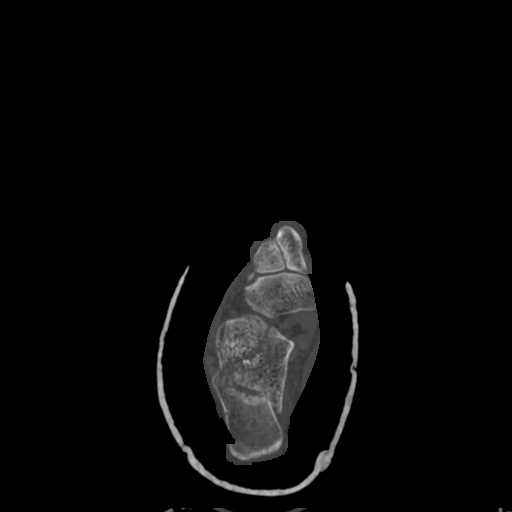
[im 220/275  bone]
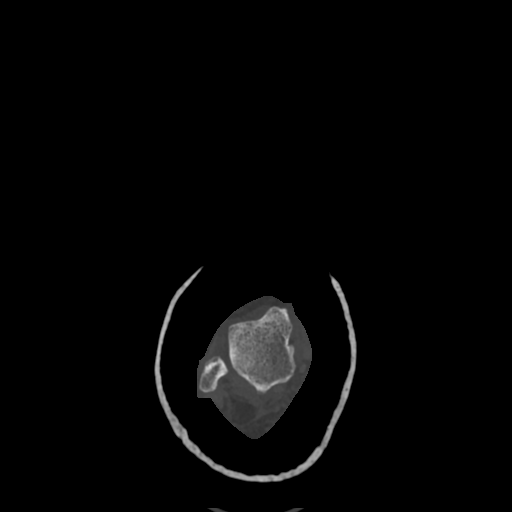

[12 of 34 positions shown; findings below may reference images not displayed]

FINDINGS: There is a central depression type fracture of the calcaneus with
moderate depression and anterior rotation of the posterior
talocalcaneal facet. There is also a vertical fracture through the
base of the sustentacular but the middle facet is maintained. The
anterior process of the calcaneus is intact.

No fractures of the tibia or talus are identified. The midfoot bony
structures are intact.

Associated subcutaneous soft tissue swelling/edema/fluid.

The major tendons and ligaments appear intact. Suspect prior
anterior talofibular ligament injury with calcifications in the
anterior aspect of the ligament. The Achilles tendon and plantar
fascia are grossly normal.
IMPRESSION: 1. Central depression type calcaneal fracture with moderate
depression and rotation.
2. No other fractures are identified.

## 2021-02-01 ENCOUNTER — Encounter: Payer: Self-pay | Admitting: Emergency Medicine

## 2021-02-01 ENCOUNTER — Other Ambulatory Visit: Payer: Self-pay

## 2021-02-01 ENCOUNTER — Ambulatory Visit
Admission: EM | Admit: 2021-02-01 | Discharge: 2021-02-01 | Disposition: A | Discharge status: Home / Self Care | Payer: BC Managed Care – PPO

## 2021-02-01 DIAGNOSIS — J069 Acute upper respiratory infection, unspecified: Secondary | ICD-10-CM

## 2021-02-01 MED ORDER — ALBUTEROL SULFATE HFA 108 (90 BASE) MCG/ACT IN AERS: 1.0000 | INHALATION_SPRAY | Freq: Four times a day (QID) | RESPIRATORY_TRACT | 0 refills | Status: DC | PRN

## 2021-02-01 NOTE — ED Triage Notes (Signed)
Cough, congestion, fever x 3 days. Smoker, hx of bronchitis.

## 2021-02-01 NOTE — ED Provider Notes (Signed)
[facility]    CSN: [id number] Arrival date & time: 02/01/21  0805      History   Chief Complaint Chief Complaint  Patient presents with   Cough    HPI Alec Key is a 50 y.o. male.   Patient here today for evaluation of nasal congestion, cough, sore throat and fever that started about 3 days ago.  He reports that fever has seemed to resolve with time but reports T-max of 101.  He does not report any nausea, vomiting or diarrhea.  He denies any ear discomfort.  He has not tried any medication other than his typical allergy medicines without significant relief.  Requests albuterol inhaler if possible.  He does have history of pneumonia.  The history is provided by the patient.  Cough Associated symptoms: chills, fever and sore throat   Associated symptoms: no ear pain, no eye discharge and no shortness of breath    Past Medical History:  Diagnosis Date   Arthritis    Avascular necrosis of bone of right hip (HCC)    Difficulty sleeping    DUE TO HIP PAIN   History of kidney stones     Patient Active Problem List   Diagnosis Date Noted   S/P right THA, AA 07/18/2014    Past Surgical History:  Procedure Laterality Date   BACK SURGERY  june 2015   L4-L5 DISCECTOMY   JOINT REPLACEMENT     TOTAL HIP ARTHROPLASTY Right 07/18/2014   Procedure: RIGHT TOTAL HIP ARTHROPLASTY ANTERIOR APPROACH;  Surgeon: Durene Romans, MD;  Location: [facility];  Service: Orthopedics;  Laterality: Right;       Home Medications    Prior to Admission medications   Medication Sig Start Date End Date Taking? Authorizing Provider  albuterol (VENTOLIN HFA) 108 (90 Base) MCG/ACT inhaler Inhale 1-2 puffs into the lungs every 6 (six) hours as needed for wheezing or shortness of breath. 02/01/21  Yes Tomi Bamberger, PA-C  loratadine (CLARITIN) 10 MG tablet Take 10 mg by mouth daily.   Yes [provider]    Family History Family History  Problem Relation Age of Onset    Diabetes Mother    Heart Problems Father     Social History Social History   Tobacco Use   Smoking status: Every Day    Packs/day: 1.50    Types: Cigarettes   Smokeless tobacco: Never  Vaping Use   Vaping Use: Never used  Substance Use Topics   Alcohol use: Yes    Comment: 4-5 BEERS DAILY   Drug use: Yes    Types: Marijuana    Comment: occasionally     Allergies   Patient has no known allergies.   Review of Systems Review of Systems  Constitutional:  Positive for chills and fever.  HENT:  Positive for congestion and sore throat. Negative for ear pain.   Eyes:  Negative for discharge and redness.  Respiratory:  Positive for cough. Negative for shortness of breath.   Gastrointestinal:  Negative for abdominal pain, nausea and vomiting.    Physical Exam Triage Vital Signs ED Triage Vitals [02/01/21 0817]  Enc Vitals Group     BP (!) 134/92     Pulse Rate 87     Resp 16     Temp 98.2 F (36.8 C)     Temp Source Oral     SpO2 97 %     Weight      Height  Head Circumference      Peak Flow      Pain Score 0     Pain Loc      Pain Edu?      Excl. in [place]?    No data found.  Updated Vital Signs BP (!) 134/92 (BP Location: Left Arm)   Pulse 87   Temp 98.2 F (36.8 C) (Oral)   Resp 16   SpO2 97%      Physical Exam Vitals and nursing note reviewed.  Constitutional:      General: He is not in acute distress.    Appearance: Normal appearance. He is not ill-appearing.  HENT:     Head: Normocephalic and atraumatic.     Nose: Congestion present.     Mouth/Throat:     Mouth: Mucous membranes are moist.     Pharynx: Oropharynx is clear. Posterior oropharyngeal erythema present. No oropharyngeal exudate.  Eyes:     Conjunctiva/sclera: Conjunctivae normal.  Cardiovascular:     Rate and Rhythm: Normal rate and regular rhythm.     Heart sounds: Normal heart sounds. No murmur heard. Pulmonary:     Effort: Pulmonary effort is normal. No respiratory  distress.     Breath sounds: Normal breath sounds. No wheezing, rhonchi or rales.  Skin:    General: Skin is warm and dry.  Neurological:     Mental Status: He is alert.  Psychiatric:        Mood and Affect: Mood normal.        Thought Content: Thought content normal.     UC Treatments / Results  Labs (all labs ordered are listed, but only abnormal results are displayed) Labs Reviewed  COVID-19, FLU A+B NAA    EKG   Radiology No results found.  Procedures Procedures (including critical care time)  Medications Ordered in UC Medications - No data to display  Initial Impression / Assessment and Plan / UC Course  I have reviewed the triage vital signs and the nursing notes.  Pertinent labs & imaging results that were available during my care of the patient were reviewed by me and considered in my medical decision making (see chart for details).  Albuterol inhaler prescribed as requested.  Suspect viral cause of symptoms and will order flu and COVID screening.  Recommended follow-up if symptoms do not improve or worsen anyway.  Will await results for further recommendation.    Final Clinical Impressions(s) / UC Diagnoses   Final diagnoses:  Acute upper respiratory infection   Discharge Instructions   None    ED Prescriptions     Medication Sig Dispense Auth. Provider   albuterol (VENTOLIN HFA) 108 (90 Base) MCG/ACT inhaler Inhale 1-2 puffs into the lungs every 6 (six) hours as needed for wheezing or shortness of breath. 8 g Tomi Bamberger, PA-C      PDMP not reviewed this encounter.   Tomi Bamberger, PA-C 02/01/21 [phone]

## 2021-02-02 LAB — COVID-19, FLU A+B NAA
Influenza A, NAA: NOT DETECTED
Influenza B, NAA: NOT DETECTED
SARS-CoV-2, NAA: NOT DETECTED

## 2021-02-19 DIAGNOSIS — F17210 Nicotine dependence, cigarettes, uncomplicated: Secondary | ICD-10-CM | POA: Diagnosis not present

## 2021-02-19 DIAGNOSIS — Z131 Encounter for screening for diabetes mellitus: Secondary | ICD-10-CM | POA: Diagnosis not present

## 2021-02-19 DIAGNOSIS — Z0000 Encounter for general adult medical examination without abnormal findings: Secondary | ICD-10-CM | POA: Diagnosis not present

## 2021-02-19 DIAGNOSIS — E559 Vitamin D deficiency, unspecified: Secondary | ICD-10-CM | POA: Diagnosis not present

## 2021-02-19 DIAGNOSIS — R5383 Other fatigue: Secondary | ICD-10-CM | POA: Diagnosis not present

## 2021-03-05 DIAGNOSIS — E781 Pure hyperglyceridemia: Secondary | ICD-10-CM | POA: Diagnosis not present

## 2021-03-05 DIAGNOSIS — R945 Abnormal results of liver function studies: Secondary | ICD-10-CM | POA: Diagnosis not present

## 2021-03-05 DIAGNOSIS — E559 Vitamin D deficiency, unspecified: Secondary | ICD-10-CM | POA: Diagnosis not present

## 2021-03-05 DIAGNOSIS — N183 Chronic kidney disease, stage 3 unspecified: Secondary | ICD-10-CM | POA: Diagnosis not present

## 2021-03-07 DIAGNOSIS — R945 Abnormal results of liver function studies: Secondary | ICD-10-CM | POA: Diagnosis not present

## 2021-03-07 DIAGNOSIS — N183 Chronic kidney disease, stage 3 unspecified: Secondary | ICD-10-CM | POA: Diagnosis not present

## 2021-04-16 DIAGNOSIS — Z1211 Encounter for screening for malignant neoplasm of colon: Secondary | ICD-10-CM | POA: Diagnosis not present

## 2021-04-16 DIAGNOSIS — R945 Abnormal results of liver function studies: Secondary | ICD-10-CM | POA: Diagnosis not present

## 2021-04-16 DIAGNOSIS — K219 Gastro-esophageal reflux disease without esophagitis: Secondary | ICD-10-CM | POA: Diagnosis not present

## 2021-04-26 DIAGNOSIS — Z1211 Encounter for screening for malignant neoplasm of colon: Secondary | ICD-10-CM | POA: Diagnosis not present

## 2021-04-26 DIAGNOSIS — K635 Polyp of colon: Secondary | ICD-10-CM | POA: Diagnosis not present

## 2021-04-29 DIAGNOSIS — Z1211 Encounter for screening for malignant neoplasm of colon: Secondary | ICD-10-CM | POA: Diagnosis not present

## 2021-04-29 DIAGNOSIS — K219 Gastro-esophageal reflux disease without esophagitis: Secondary | ICD-10-CM | POA: Diagnosis not present

## 2021-05-02 DIAGNOSIS — K635 Polyp of colon: Secondary | ICD-10-CM | POA: Diagnosis not present

## 2021-05-06 DIAGNOSIS — K200 Eosinophilic esophagitis: Secondary | ICD-10-CM | POA: Diagnosis not present

## 2021-05-30 DIAGNOSIS — K219 Gastro-esophageal reflux disease without esophagitis: Secondary | ICD-10-CM | POA: Diagnosis not present

## 2021-05-30 DIAGNOSIS — D126 Benign neoplasm of colon, unspecified: Secondary | ICD-10-CM | POA: Diagnosis not present

## 2021-05-30 DIAGNOSIS — K648 Other hemorrhoids: Secondary | ICD-10-CM | POA: Diagnosis not present

## 2021-06-04 DIAGNOSIS — E781 Pure hyperglyceridemia: Secondary | ICD-10-CM | POA: Diagnosis not present

## 2021-06-04 DIAGNOSIS — Z79899 Other long term (current) drug therapy: Secondary | ICD-10-CM | POA: Diagnosis not present

## 2021-06-04 DIAGNOSIS — M129 Arthropathy, unspecified: Secondary | ICD-10-CM | POA: Diagnosis not present

## 2021-06-04 DIAGNOSIS — R7303 Prediabetes: Secondary | ICD-10-CM | POA: Diagnosis not present

## 2021-06-04 DIAGNOSIS — F17210 Nicotine dependence, cigarettes, uncomplicated: Secondary | ICD-10-CM | POA: Diagnosis not present

## 2021-06-04 DIAGNOSIS — G4730 Sleep apnea, unspecified: Secondary | ICD-10-CM | POA: Diagnosis not present

## 2021-06-26 DIAGNOSIS — R400 Somnolence: Secondary | ICD-10-CM | POA: Diagnosis not present

## 2021-06-26 DIAGNOSIS — Z6826 Body mass index (BMI) 26.0-26.9, adult: Secondary | ICD-10-CM | POA: Diagnosis not present

## 2021-06-26 DIAGNOSIS — K219 Gastro-esophageal reflux disease without esophagitis: Secondary | ICD-10-CM | POA: Diagnosis not present

## 2021-06-26 DIAGNOSIS — R7303 Prediabetes: Secondary | ICD-10-CM | POA: Diagnosis not present

## 2021-07-15 DIAGNOSIS — G4736 Sleep related hypoventilation in conditions classified elsewhere: Secondary | ICD-10-CM | POA: Diagnosis not present

## 2021-07-15 DIAGNOSIS — G4733 Obstructive sleep apnea (adult) (pediatric): Secondary | ICD-10-CM | POA: Diagnosis not present

## 2021-07-23 DIAGNOSIS — G4733 Obstructive sleep apnea (adult) (pediatric): Secondary | ICD-10-CM | POA: Diagnosis not present

## 2021-07-23 DIAGNOSIS — K219 Gastro-esophageal reflux disease without esophagitis: Secondary | ICD-10-CM | POA: Diagnosis not present

## 2021-07-23 DIAGNOSIS — Z6826 Body mass index (BMI) 26.0-26.9, adult: Secondary | ICD-10-CM | POA: Diagnosis not present

## 2021-07-23 DIAGNOSIS — N183 Chronic kidney disease, stage 3 unspecified: Secondary | ICD-10-CM | POA: Diagnosis not present

## 2021-08-15 DIAGNOSIS — G4733 Obstructive sleep apnea (adult) (pediatric): Secondary | ICD-10-CM | POA: Diagnosis not present

## 2021-09-10 DIAGNOSIS — F17210 Nicotine dependence, cigarettes, uncomplicated: Secondary | ICD-10-CM | POA: Diagnosis not present

## 2021-09-10 DIAGNOSIS — E781 Pure hyperglyceridemia: Secondary | ICD-10-CM | POA: Diagnosis not present

## 2021-09-10 DIAGNOSIS — Z79899 Other long term (current) drug therapy: Secondary | ICD-10-CM | POA: Diagnosis not present

## 2021-09-10 DIAGNOSIS — R7303 Prediabetes: Secondary | ICD-10-CM | POA: Diagnosis not present

## 2021-09-10 DIAGNOSIS — M109 Gout, unspecified: Secondary | ICD-10-CM | POA: Diagnosis not present

## 2021-09-15 DIAGNOSIS — G4733 Obstructive sleep apnea (adult) (pediatric): Secondary | ICD-10-CM | POA: Diagnosis not present

## 2021-10-01 DIAGNOSIS — K219 Gastro-esophageal reflux disease without esophagitis: Secondary | ICD-10-CM | POA: Diagnosis not present

## 2021-10-01 DIAGNOSIS — Z6826 Body mass index (BMI) 26.0-26.9, adult: Secondary | ICD-10-CM | POA: Diagnosis not present

## 2021-10-01 DIAGNOSIS — G4733 Obstructive sleep apnea (adult) (pediatric): Secondary | ICD-10-CM | POA: Diagnosis not present

## 2021-10-01 DIAGNOSIS — R7303 Prediabetes: Secondary | ICD-10-CM | POA: Diagnosis not present

## 2021-10-15 DIAGNOSIS — G4733 Obstructive sleep apnea (adult) (pediatric): Secondary | ICD-10-CM | POA: Diagnosis not present

## 2021-11-08 DIAGNOSIS — H903 Sensorineural hearing loss, bilateral: Secondary | ICD-10-CM | POA: Diagnosis not present

## 2021-11-08 DIAGNOSIS — H401131 Primary open-angle glaucoma, bilateral, mild stage: Secondary | ICD-10-CM | POA: Diagnosis not present

## 2021-11-15 DIAGNOSIS — G4733 Obstructive sleep apnea (adult) (pediatric): Secondary | ICD-10-CM | POA: Diagnosis not present

## 2021-12-11 DIAGNOSIS — H401134 Primary open-angle glaucoma, bilateral, indeterminate stage: Secondary | ICD-10-CM | POA: Diagnosis not present

## 2021-12-13 DIAGNOSIS — M25552 Pain in left hip: Secondary | ICD-10-CM | POA: Insufficient documentation

## 2021-12-16 DIAGNOSIS — G4733 Obstructive sleep apnea (adult) (pediatric): Secondary | ICD-10-CM | POA: Diagnosis not present

## 2021-12-18 DIAGNOSIS — M25552 Pain in left hip: Secondary | ICD-10-CM | POA: Diagnosis not present

## 2021-12-27 DIAGNOSIS — M25552 Pain in left hip: Secondary | ICD-10-CM | POA: Diagnosis not present

## 2022-01-15 DIAGNOSIS — G4733 Obstructive sleep apnea (adult) (pediatric): Secondary | ICD-10-CM | POA: Diagnosis not present

## 2022-01-15 DIAGNOSIS — M1612 Unilateral primary osteoarthritis, left hip: Secondary | ICD-10-CM | POA: Diagnosis not present

## 2022-01-15 DIAGNOSIS — M87852 Other osteonecrosis, left femur: Secondary | ICD-10-CM | POA: Diagnosis not present

## 2022-01-15 DIAGNOSIS — M25552 Pain in left hip: Secondary | ICD-10-CM | POA: Diagnosis not present

## 2022-01-17 ENCOUNTER — Ambulatory Visit
Admission: EM | Admit: 2022-01-17 | Discharge: 2022-01-17 | Disposition: A | Discharge status: Home / Self Care | Payer: BC Managed Care – PPO | Attending: Physician Assistant | Admitting: Physician Assistant

## 2022-01-17 DIAGNOSIS — J441 Chronic obstructive pulmonary disease with (acute) exacerbation: Secondary | ICD-10-CM

## 2022-01-17 MED ORDER — ALBUTEROL SULFATE HFA 108 (90 BASE) MCG/ACT IN AERS: 1.0000 | INHALATION_SPRAY | Freq: Four times a day (QID) | RESPIRATORY_TRACT | 1 refills | Status: DC | PRN

## 2022-01-17 MED ORDER — IPRATROPIUM-ALBUTEROL 0.5-2.5 (3) MG/3ML IN SOLN
3.0000 mL | Freq: Once | RESPIRATORY_TRACT | Status: AC
  Administered 2022-01-17: 3 mL via RESPIRATORY_TRACT

## 2022-01-17 MED ORDER — PREDNISONE 10 MG (21) PO TBPK: ORAL_TABLET | ORAL | 0 refills | Status: DC

## 2022-01-17 MED ORDER — CEFDINIR 300 MG PO CAPS: 300.0000 mg | ORAL_CAPSULE | Freq: Two times a day (BID) | ORAL | 0 refills | Status: DC

## 2022-01-17 NOTE — ED Provider Notes (Signed)
[facility]    CSN: [id number] Arrival date & time: 01/17/22  5732      History   Chief Complaint Chief Complaint  Patient presents with   Facial Pain    HPI Alec Key is a 51 y.o. male.   Patient presents today with a week plus long history of URI symptoms.  Reports cough, congestion, shortness of breath, wheezing.  Denies any fever, chest pain, nausea, vomiting, diarrhea.  Denies any known sick contacts.  He has tried Tylenol Cold/sinus without improvement of symptoms.  Denies any recent antibiotic or steroid use.  Denies history of diabetes but reports he might of been prediabetic at his last office visit.  He is a current everyday smoker smoking between 1.5 and 2 packs/day.  Denies formal diagnosis of COPD but does believe he has this.  Reports that he gets bronchitis several times a year requiring albuterol inhaler.  He does not currently have an albuterol inhaler available.    Past Medical History:  Diagnosis Date   Arthritis    Avascular necrosis of bone of right hip ([place])    Difficulty sleeping    DUE TO HIP PAIN   History of kidney stones     Patient Active Problem List   Diagnosis Date Noted   S/P right THA, AA 07/18/2014    Past Surgical History:  Procedure Laterality Date   BACK SURGERY  june 2015   L4-L5 DISCECTOMY   JOINT REPLACEMENT     TOTAL HIP ARTHROPLASTY Right 07/18/2014   Procedure: RIGHT TOTAL HIP ARTHROPLASTY ANTERIOR APPROACH;  Surgeon: Paralee Cancel, MD;  Location: [facility];  Service: Orthopedics;  Laterality: Right;       Home Medications    Prior to Admission medications   Medication Sig Start Date End Date Taking? Authorizing Provider  cefdinir (OMNICEF) 300 MG capsule Take 1 capsule (300 mg total) by mouth 2 (two) times daily. 01/17/22  Yes Santanna Olenik K, PA-C  predniSONE (STERAPRED UNI-PAK 21 TAB) 10 MG (21) TBPK tablet As directed 01/17/22  Yes Thurley Francesconi K, PA-C  albuterol (VENTOLIN HFA) 108 (90 Base) MCG/ACT inhaler  Inhale 1-2 puffs into the lungs every 6 (six) hours as needed for wheezing or shortness of breath. 01/17/22   Nelline Lio, Derry Skill, PA-C  loratadine (CLARITIN) 10 MG tablet Take 10 mg by mouth daily.    [provider]    Family History Family History  Problem Relation Age of Onset   Diabetes Mother    Heart Problems Father     Social History Social History   Tobacco Use   Smoking status: Every Day    Packs/day: 1.50    Years: 30.00    Total pack years: 45.00    Types: Cigarettes   Smokeless tobacco: Never   Tobacco comments:    Requesting prescription for assistance   Vaping Use   Vaping Use: Some days   Substances: THC  Substance Use Topics   Alcohol use: Not Currently    Alcohol/week: 42.0 standard drinks of alcohol    Types: 42 Cans of beer per week    Comment: 6 BEERS DAILY   Drug use: Yes    Types: Marijuana    Comment: occasionally     Allergies   Patient has no known allergies.   Review of Systems Review of Systems  Constitutional:  Positive for activity change and fatigue. Negative for appetite change and fever.  HENT:  Positive for congestion. Negative for sinus pressure, sneezing and  sore throat.   Respiratory:  Positive for cough, chest tightness and shortness of breath. Negative for wheezing.   Cardiovascular:  Negative for chest pain.  Gastrointestinal:  Negative for abdominal pain, diarrhea, nausea and vomiting.  Neurological:  Negative for dizziness, light-headedness and headaches.     Physical Exam Triage Vital Signs ED Triage Vitals  Enc Vitals Group     BP 01/17/22 0825 (!) 132/90     Pulse Rate 01/17/22 0825 73     Resp 01/17/22 0825 20     Temp 01/17/22 0825 98.1 F (36.7 C)     Temp src --      SpO2 01/17/22 0825 99 %     Weight --      Height --      Head Circumference --      Peak Flow --      Pain Score 01/17/22 0824 0     Pain Loc --      Pain Edu? --      Excl. in GC? --    No data found.  Updated Vital Signs BP  (!) 132/90   Pulse 73   Temp 98.1 F (36.7 C)   Resp 20   SpO2 99%   Visual Acuity Right Eye Distance:   Left Eye Distance:   Bilateral Distance:    Right Eye Near:   Left Eye Near:    Bilateral Near:     Physical Exam Vitals reviewed.  Constitutional:      General: He is awake.     Appearance: Normal appearance. He is well-developed. He is not ill-appearing.     Comments: Very pleasant male appears stated age in no acute distress sitting comfortably in exam room  HENT:     Head: Normocephalic and atraumatic.     Right Ear: Tympanic membrane, ear canal and external ear normal. Tympanic membrane is not erythematous or bulging.     Left Ear: Tympanic membrane, ear canal and external ear normal. Tympanic membrane is not erythematous or bulging.     Nose:     Right Sinus: Maxillary sinus tenderness present. No frontal sinus tenderness.     Left Sinus: Maxillary sinus tenderness present. No frontal sinus tenderness.     Mouth/Throat:     Pharynx: Uvula midline. Posterior oropharyngeal erythema present. No oropharyngeal exudate.  Cardiovascular:     Rate and Rhythm: Normal rate and regular rhythm.     Heart sounds: Normal heart sounds, S1 normal and S2 normal. No murmur heard. Pulmonary:     Effort: Pulmonary effort is normal. No accessory muscle usage or respiratory distress.     Breath sounds: No stridor. Wheezing and rhonchi present. No rales.     Comments: Scattered wheezing and rhonchi partially clear with cough.  Significant improvement of wheezing and rhonchi with DuoNeb in clinic. Abdominal:     General: Bowel sounds are normal.     Palpations: Abdomen is soft.     Tenderness: There is no abdominal tenderness.  Neurological:     Mental Status: He is alert.  Psychiatric:        Behavior: Behavior is cooperative.      UC Treatments / Results  Labs (all labs ordered are listed, but only abnormal results are displayed) Labs Reviewed - No data to  display  EKG   Radiology No results found.  Procedures Procedures (including critical care time)  Medications Ordered in UC Medications  ipratropium-albuterol (DUONEB) 0.5-2.5 (3) MG/3ML nebulizer solution 3 mL (  3 mLs Nebulization Given 01/17/22 0905)    Initial Impression / Assessment and Plan / UC Course  I have reviewed the triage vital signs and the nursing notes.  Pertinent labs & imaging results that were available during my care of the patient were reviewed by me and considered in my medical decision making (see chart for details).     Patient is well-appearing, afebrile, nontoxic, nontachycardic with oxygen saturation of 99%.  Chest x-ray was deferred as his oxygen saturation was 99% and his lungs significantly improved following DuoNeb.  No indication for viral testing as patient has been symptomatic for over a week.  We discussed the importance of smoking cessation and encouraged him to wean his daily allowance of cigarettes.  Also recommended that he follow-up with his primary care to consider Chantix and other medications but these do not think that we typically begin in urgent care.  We discussed that I believe he has COPD but it has not yet been diagnosed.  We will treat for COPD exacerbation.  Patient was provided an albuterol inhaler to use every 4-6 hours.  He was started on prednisone with instruction not to take NSAIDs with this medication due to risk of GI bleeding.  We will start Omnicef twice daily for 10 days.  He is to use over-the-counter medications including Mucinex, Flonase, Tylenol.  Recommended he rest and drink plenty of fluid.  Discussed that if he has any worsening symptoms including increased cough, shortness of breath, fever, nausea, vomiting, weakness he needs to be seen immediately.  Strict return precautions given.  Work excuse note provided.  Final Clinical Impressions(s) / UC Diagnoses   Final diagnoses:  COPD exacerbation (HCC)     Discharge  Instructions      Use albuterol inhaler every 4-6 hours as needed.  Take cefdinir twice daily for 10 days to cover for infection.  Start prednisone taper.  Do not take NSAIDs with this medication including aspirin, ibuprofen/Advil, naproxen/Aleve.  Follow-up with your primary care next week.  If you have any worsening symptoms including increased shortness of breath, worsening cough, fever, nausea, vomiting, weakness you need to be seen immediately.     ED Prescriptions     Medication Sig Dispense Auth. Provider   albuterol (VENTOLIN HFA) 108 (90 Base) MCG/ACT inhaler Inhale 1-2 puffs into the lungs every 6 (six) hours as needed for wheezing or shortness of breath. 18 g Sherlin Sonier K, PA-C   predniSONE (STERAPRED UNI-PAK 21 TAB) 10 MG (21) TBPK tablet As directed 21 tablet Dan Dissinger K, PA-C   cefdinir (OMNICEF) 300 MG capsule Take 1 capsule (300 mg total) by mouth 2 (two) times daily. 20 capsule Laryn Venning, Noberto Retort, PA-C      PDMP not reviewed this encounter.   Jeani Hawking, PA-C 01/17/22 [id number]

## 2022-01-17 NOTE — ED Triage Notes (Addendum)
Pt presents to uc with co of congestion and cough and white phelm for 2 weeks. Pt has been taking otc sinus medication with minimal relief.   When asked about tobacco use pt requested help to quit smoking, pt reports up coming surgery and would like to stop in preparation.

## 2022-01-17 NOTE — Discharge Instructions (Signed)
Use albuterol inhaler every 4-6 hours as needed.  Take cefdinir twice daily for 10 days to cover for infection.  Start prednisone taper.  Do not take NSAIDs with this medication including aspirin, ibuprofen/Advil, naproxen/Aleve.  Follow-up with your primary care next week.  If you have any worsening symptoms including increased shortness of breath, worsening cough, fever, nausea, vomiting, weakness you need to be seen immediately.

## 2022-01-21 DIAGNOSIS — M1632 Unilateral osteoarthritis resulting from hip dysplasia, left hip: Secondary | ICD-10-CM | POA: Insufficient documentation

## 2022-02-03 DIAGNOSIS — Z01818 Encounter for other preprocedural examination: Secondary | ICD-10-CM | POA: Diagnosis not present

## 2022-02-14 DIAGNOSIS — M87052 Idiopathic aseptic necrosis of left femur: Secondary | ICD-10-CM | POA: Diagnosis not present

## 2022-02-14 DIAGNOSIS — M1612 Unilateral primary osteoarthritis, left hip: Secondary | ICD-10-CM | POA: Diagnosis not present

## 2022-02-15 DIAGNOSIS — G4733 Obstructive sleep apnea (adult) (pediatric): Secondary | ICD-10-CM | POA: Diagnosis not present

## 2022-03-17 DIAGNOSIS — H401131 Primary open-angle glaucoma, bilateral, mild stage: Secondary | ICD-10-CM | POA: Diagnosis not present

## 2022-03-17 DIAGNOSIS — G4733 Obstructive sleep apnea (adult) (pediatric): Secondary | ICD-10-CM | POA: Diagnosis not present

## 2022-03-18 DIAGNOSIS — H9313 Tinnitus, bilateral: Secondary | ICD-10-CM | POA: Diagnosis not present

## 2022-03-18 DIAGNOSIS — H903 Sensorineural hearing loss, bilateral: Secondary | ICD-10-CM | POA: Diagnosis not present

## 2022-04-02 ENCOUNTER — Other Ambulatory Visit: Payer: Self-pay | Admitting: Otolaryngology

## 2022-04-02 DIAGNOSIS — H9042 Sensorineural hearing loss, unilateral, left ear, with unrestricted hearing on the contralateral side: Secondary | ICD-10-CM

## 2022-04-17 DIAGNOSIS — G4733 Obstructive sleep apnea (adult) (pediatric): Secondary | ICD-10-CM | POA: Diagnosis not present

## 2022-05-01 ENCOUNTER — Ambulatory Visit
Admission: RE | Admit: 2022-05-01 | Discharge: 2022-05-01 | Disposition: A | Discharge status: Home / Self Care | Payer: BC Managed Care – PPO | Source: Ambulatory Visit | Attending: Otolaryngology | Admitting: Otolaryngology

## 2022-05-01 DIAGNOSIS — H9042 Sensorineural hearing loss, unilateral, left ear, with unrestricted hearing on the contralateral side: Secondary | ICD-10-CM

## 2022-05-01 DIAGNOSIS — H903 Sensorineural hearing loss, bilateral: Secondary | ICD-10-CM | POA: Diagnosis not present

## 2022-05-01 MED ORDER — GADOPICLENOL 0.5 MMOL/ML IV SOLN
8.0000 mL | Freq: Once | INTRAVENOUS | Status: AC | PRN
  Administered 2022-05-01: 8 mL via INTRAVENOUS

## 2022-05-18 DIAGNOSIS — G4733 Obstructive sleep apnea (adult) (pediatric): Secondary | ICD-10-CM | POA: Diagnosis not present

## 2022-06-05 DIAGNOSIS — G4733 Obstructive sleep apnea (adult) (pediatric): Secondary | ICD-10-CM | POA: Diagnosis not present

## 2022-06-17 DIAGNOSIS — H401131 Primary open-angle glaucoma, bilateral, mild stage: Secondary | ICD-10-CM | POA: Diagnosis not present

## 2022-10-03 DIAGNOSIS — Z0000 Encounter for general adult medical examination without abnormal findings: Secondary | ICD-10-CM | POA: Diagnosis not present

## 2022-10-03 DIAGNOSIS — F1010 Alcohol abuse, uncomplicated: Secondary | ICD-10-CM | POA: Diagnosis not present

## 2022-10-03 DIAGNOSIS — K219 Gastro-esophageal reflux disease without esophagitis: Secondary | ICD-10-CM | POA: Diagnosis not present

## 2022-10-03 DIAGNOSIS — G44009 Cluster headache syndrome, unspecified, not intractable: Secondary | ICD-10-CM | POA: Diagnosis not present

## 2022-10-03 DIAGNOSIS — Z13220 Encounter for screening for lipoid disorders: Secondary | ICD-10-CM | POA: Diagnosis not present

## 2022-10-03 DIAGNOSIS — D229 Melanocytic nevi, unspecified: Secondary | ICD-10-CM | POA: Diagnosis not present

## 2022-10-03 DIAGNOSIS — Z125 Encounter for screening for malignant neoplasm of prostate: Secondary | ICD-10-CM | POA: Diagnosis not present

## 2022-10-17 DIAGNOSIS — Z23 Encounter for immunization: Secondary | ICD-10-CM | POA: Diagnosis not present

## 2022-10-18 DIAGNOSIS — Z86010 Personal history of colonic polyps: Secondary | ICD-10-CM | POA: Diagnosis not present

## 2022-10-28 DIAGNOSIS — G4733 Obstructive sleep apnea (adult) (pediatric): Secondary | ICD-10-CM | POA: Diagnosis not present

## 2022-11-03 DIAGNOSIS — H401134 Primary open-angle glaucoma, bilateral, indeterminate stage: Secondary | ICD-10-CM | POA: Diagnosis not present

## 2022-12-12 DIAGNOSIS — K635 Polyp of colon: Secondary | ICD-10-CM | POA: Diagnosis not present

## 2022-12-12 DIAGNOSIS — Z86010 Personal history of colonic polyps: Secondary | ICD-10-CM | POA: Diagnosis not present

## 2022-12-12 DIAGNOSIS — Z1211 Encounter for screening for malignant neoplasm of colon: Secondary | ICD-10-CM | POA: Diagnosis not present

## 2022-12-24 DIAGNOSIS — K635 Polyp of colon: Secondary | ICD-10-CM | POA: Diagnosis not present

## 2023-01-02 DIAGNOSIS — Z23 Encounter for immunization: Secondary | ICD-10-CM | POA: Diagnosis not present

## 2023-02-03 DIAGNOSIS — H401131 Primary open-angle glaucoma, bilateral, mild stage: Secondary | ICD-10-CM | POA: Diagnosis not present

## 2023-02-24 ENCOUNTER — Encounter: Payer: Self-pay | Admitting: Dermatology

## 2023-02-24 ENCOUNTER — Ambulatory Visit: Payer: BC Managed Care – PPO | Admitting: Dermatology

## 2023-02-24 DIAGNOSIS — L720 Epidermal cyst: Secondary | ICD-10-CM | POA: Diagnosis not present

## 2023-02-24 DIAGNOSIS — L821 Other seborrheic keratosis: Secondary | ICD-10-CM | POA: Diagnosis not present

## 2023-02-24 DIAGNOSIS — L708 Other acne: Secondary | ICD-10-CM

## 2023-02-24 NOTE — Patient Instructions (Signed)
Important Information  Due to recent changes in healthcare laws, you may see results of your pathology and/or laboratory studies on MyChart before the doctors have had a chance to review them. We understand that in some cases there may be results that are confusing or concerning to you. Please understand that not all results are received at the same time and often the doctors may need to interpret multiple results in order to provide you with the best plan of care or course of treatment. Therefore, we ask that you please give [place] 2 business days to thoroughly review all your results before contacting the office for clarification. Should we see a critical lab result, you will be contacted sooner.   If You Need Anything After Your Visit  If you have any questions or concerns for your doctor, please call our main line at [phone] If no one answers, please leave a voicemail as directed and we will return your call as soon as possible. Messages left after 4 pm will be answered the following business day.   You may also send [place] a message via MyChart. We typically respond to MyChart messages within 1-2 business days.  For prescription refills, please ask your pharmacy to contact our office. Our fax number is [phone].  If you have an urgent issue when the clinic is closed that cannot wait until the next business day, you can page your doctor at the number below.    Please note that while we do our best to be available for urgent issues outside of office hours, we are not available 24/7.   If you have an urgent issue and are unable to reach [place], you may choose to seek medical care at your doctor's office, retail clinic, urgent care center, or emergency room.  If you have a medical emergency, please immediately call 911 or go to the emergency department. In the event of inclement weather, please call our main line at [phone] for an update on the status of any delays or  closures.  Dermatology Medication Tips: Please keep the boxes that topical medications come in in order to help keep track of the instructions about where and how to use these. Pharmacies typically print the medication instructions only on the boxes and not directly on the medication tubes.   If your medication is too expensive, please contact our office at [phone] or send [place] a message through MyChart.   We are unable to tell what your co-pay for medications will be in advance as this is different depending on your insurance coverage. However, we may be able to find a substitute medication at lower cost or fill out paperwork to get insurance to cover a needed medication.   If a prior authorization is required to get your medication covered by your insurance company, please allow [place] 1-2 business days to complete this process.  Drug prices often vary depending on where the prescription is filled and some pharmacies may offer cheaper prices.  The website www.goodrx.com contains coupons for medications through different pharmacies. The prices here do not account for what the cost may be with help from insurance (it may be cheaper with your insurance), but the website can give you the price if you did not use any insurance.  - You can print the associated coupon and take it with your prescription to the pharmacy.  - You may also stop by our office during regular business hours and pick up a GoodRx coupon card.  - If  you need your prescription sent electronically to a different pharmacy, notify our office through [facility] or by phone at [phone]    Skin Education :   I counseled the patient regarding the following: Sun screen (SPF 30 or greater) should be applied during peak UV exposure (between 10am and 2pm) and reapplied after exercise or swimming.  The ABCDEs of melanoma were reviewed with the patient, and the importance of monthly self-examination of moles was emphasized.  Should any moles change in shape or color, or itch, bleed or burn, pt will contact our office for evaluation sooner then their interval appointment.  Plan: Sunscreen Recommendations I recommended a broad spectrum sunscreen with a SPF of 30 or higher. I explained that SPF 30 sunscreens block approximately 97 percent of the sun's harmful rays. Sunscreens should be applied at least 15 minutes prior to expected sun exposure and then every 2 hours after that as long as sun exposure continues. If swimming or exercising sunscreen should be reapplied every 45 minutes to an hour after getting wet or sweating. One ounce, or the equivalent of a shot glass full of sunscreen, is adequate to protect the skin not covered by a bathing suit. I also recommended a lip balm with a sunscreen as well. Sun protective clothing can be used in lieu of sunscreen but must be worn the entire time you are exposed to the sun's rays.

## 2023-02-24 NOTE — Progress Notes (Signed)
   New Patient Visit   Subjective  Nikan Rathgeb is a 52 y.o. male who presents for the following: Pt. Has no hx of skin cancer or family hx of skin cancer.  The patient has spots, moles and lesions to be evaluated, some may be new or changing and the patient may have concern these could be cancer.  The following portions of the chart were reviewed this encounter and updated as appropriate: medications, allergies, medical history  Review of Systems:  No other skin or systemic complaints except as noted in HPI or Assessment and Plan.  Objective  Well appearing patient in no apparent distress; mood and affect are within normal limits.   A focused examination was performed of the following areas:   Head and face  Relevant exam findings are noted in the Assessment and Plan.   Assessment & Plan   SEBORRHEIC KERATOSIS- right temple - Stuck-on, waxy, tan-brown papules and/or plaques  - Benign-appearing - Discussed benign etiology and prognosis. - Observe - Call for any changes  EPIDERMAL INCLUSION CYST with central pore Exam: Subcutaneous nodule at right infraorbital cheek  Benign-appearing. Exam most consistent with an epidermal inclusion cyst. Discussed that a cyst is a benign growth that can grow over time and sometimes get irritated or inflamed. Recommend observation if it is not bothersome. Discussed option of surgical excision to remove it if it is growing, symptomatic, or other changes noted. Please call for new or changing lesions so they can be evaluated.   Return for return for an excision .  I, Tillie Fantasia, CMA, am acting as scribe for Gwenith Daily, MD.   Documentation: I have reviewed the above documentation for accuracy and completeness, and I agree with the above.  Gwenith Daily, MD

## 2023-04-02 ENCOUNTER — Encounter: Payer: Self-pay | Admitting: Dermatology

## 2023-04-03 ENCOUNTER — Ambulatory Visit: Payer: BC Managed Care – PPO | Admitting: Dermatology

## 2023-04-03 ENCOUNTER — Encounter: Payer: Self-pay | Admitting: Dermatology

## 2023-04-03 VITALS — BP 127/84 | HR 81 | Temp 98.1°F

## 2023-04-03 DIAGNOSIS — L720 Epidermal cyst: Secondary | ICD-10-CM | POA: Diagnosis not present

## 2023-04-03 DIAGNOSIS — C44319 Basal cell carcinoma of skin of other parts of face: Secondary | ICD-10-CM | POA: Diagnosis not present

## 2023-04-03 NOTE — Progress Notes (Signed)
Follow-Up Visit   Subjective  Alec Key is a 52 y.o. male who presents for the following: Excision of a neoplasm of skin on pt's right cheek. Lesion has been present for several years and has been growing. He has not had it treated previously.   The following portions of the chart were reviewed this encounter and updated as appropriate: medications, allergies, medical history  Review of Systems:  No other skin or systemic complaints except as noted in HPI or Assessment and Plan.  Objective  Well appearing patient in no apparent distress; mood and affect are within normal limits.  A focused examination was performed of the following areas:  Right cheek  Relevant physical exam findings are noted in the Assessment and Plan.   Right Malar Cheek Open comedone with dilated pore and keratin material   Assessment & Plan   EPIDERMAL CYST Right Malar Cheek Skin excision - Right Malar Cheek  Excision method:  elliptical Lesion length (cm):  0.9 Lesion width (cm):  0.6 Margin per side (cm):  0.1 Total excision diameter (cm):  1.1 Informed consent: discussed and consent obtained   Timeout: patient name, date of birth, surgical site, and procedure verified   Procedure prep:  Patient was prepped and draped in usual sterile fashion Prep type:  Chlorhexidine Anesthesia: the lesion was anesthetized in a standard fashion   Anesthetic:  1% lidocaine w/ epinephrine 1-100,000 buffered w/ 8.4% NaHCO3 Instrument used: #15 blade   Hemostasis achieved with: suture, pressure and electrodesiccation   Outcome: patient tolerated procedure well with no complications   Post-procedure details: sterile dressing applied and wound care instructions given   Dressing type: petrolatum and pressure dressing    Skin repair - Right Malar Cheek Complexity:  Complex Final length (cm):  2.6 Informed consent: discussed and consent obtained   Timeout: patient name, date of birth, surgical site, and procedure  verified   Procedure prep:  Patient was prepped and draped in usual sterile fashion Prep type:  Chlorhexidine Anesthesia: the lesion was anesthetized in a standard fashion   Anesthetic:  1% lidocaine w/ epinephrine 1-100,000 buffered w/ 8.4% NaHCO3 Reason for type of repair: reduce tension to allow closure, reduce subcutaneous dead space and avoid a hematoma, allow closure of the large defect, preserve normal anatomical and functional relationships and allow side-to-side closure without requiring a flap or graft   Undermining: area extensively undermined   Subcutaneous layers (deep stitches):  Suture size:  5-0 Suture type: Monocryl (poliglecaprone 25)   Stitches:  Buried vertical mattress Fine/surface layer approximation (top stitches):  Suture size:  6-0 Suture type: fast-absorbing plain gut   Stitches: simple running   Suture removal (days):  0 Hemostasis achieved with: suture, pressure and electrodesiccation Outcome: patient tolerated procedure well with no complications   Post-procedure details: sterile dressing applied and wound care instructions given   Dressing type: pressure dressing and bandage    The surgical wound was then cleaned, prepped, and re-anesthetized as above. Wound edges were undermined extensively along at least one entire edge and at a distance equal to or greater than the width of the defect (see wound defect size above) in order to achieve closure and decrease wound tension and anatomic distortion. Redundant tissue repair including standing cone removal was performed. Hemostasis was achieved with electrocautery. Subcutaneous and epidermal tissues were approximated with the above sutures. The surgical site was then lightly scrubbed with sterile, saline-soaked gauze. The area was then bandaged using Vaseline ointment, non-adherent gauze, gauze pads,  and tape to provide an adequate pressure dressing. The patient tolerated the procedure well, was given detailed written and  verbal wound care instructions, and was discharged in good condition.   The patient will follow-up: PRN.  Return if symptoms worsen or fail to improve.  Dominga Ferry, Surg Tech III, am acting as scribe for Gwenith Daily, MD.   Documentation: I have reviewed the above documentation for accuracy and completeness, and I agree with the above.  Gwenith Daily, MD

## 2023-04-03 NOTE — Patient Instructions (Signed)
Wound Care Instructions for After Surgery  On the day following your surgery, you should begin doing daily dressing changes until your sutures are removed:  Remove the bandage.  Cleanse the wound gently with soap and water.   Make sure you then dry the skin surrounding the wound completely or the tape will not stick to the skin. Do not use cotton balls on the wound.  After the wound is clean and dry, apply the ointment (either prescription antibiotic prescribed by your doctor or plain Vaseline if nothing was prescribed) gently with a Q-tip.  If you are using a bandaid to cover:  Apply a bandaid large enough to cover the entire wound.  If you do not have a bandaid large enough to cover the wound OR if you are sensitive to bandaid adhesive:  Cut a non-stick pad (such as Telfa) to fit the size of the wound.   Cover the wound with the non-stick pad.  If the wound is draining, you may want to add a small amount of gauze on top of the non-stick pad for a little added compression to the area.  Use tape to seal the area completely.  For the next 1-2 weeks:  Be sure to keep the wound moist with ointment 24/7 to ensure best healing. If you are unable to cover the wound with a bandage to hold the ointment in place, you may need to reapply the ointment several times a day.  Do not bend over or lift heavy items to reduce the chance of elevated blood pressure to the wound.  Do not participate in particularly strenuous activities.  Below is a list of dressing supplies you might need.   Cotton-tipped applicators - Q-tips  Gauze pads (2x2 and/or 4x4) - All-Purpose Sponges  New and clean tube of petroleum jelly (Vaseline) OR prescription antibiotic ointment if prescribed  Either a bandaid large enough to cover the entire wound OR non-stick dressing material (Telfa) and Tape (Paper or Hypafix)  FOR ADULT SURGERY PATIENTS: If you need something for pain relief, you may take 1 extra strength  Tylenol (acetaminophen) and 2 ibuprofen (200 mg) together every 4 hours as needed. (Do not take these medications if you are allergic to them or if you know you cannot take them for any other reason). Typically you may only need pain medication for 1-3 days.   Comments on the Post-Operative Period Slight swelling and redness often appear around the wound. This is normal and will disappear within several days following the surgery. The healing wound will drain a brownish-red-yellow discharge during healing. This is a normal phase of wound healing. As the wound begins to heal, the drainage may increase in amount. Again, this drainage is normal. Notify us if the drainage becomes persistently bloody, excessively swollen, or intensely painful or develops a foul odor or red streaks.  The healing wound will also typically be itchy. This is normal. If you have severe or persistent pain, Notify us if the discomfort is severe or persistent. Avoid alcoholic beverages when taking pain medicine.  In Case of Wound Hemorrhage A wound hemorrhage is when the bandage suddenly becomes soaked with bright red blood and flows profusely. If this happens, sit down or lie down with your head elevated. If the wound has a dressing on it, do not remove the dressing. Apply pressure to the existing gauze. If the wound is not covered, use a gauze pad to apply pressure and continue applying the pressure for 20 minutes without   peeking. DO NOT COVER THE WOUND WITH A LARGE TOWEL OR WASH CLOTH. Release your hand from the wound site but do not remove the dressing. If the bleeding has stopped, gently clean around the wound. Leave the dressing in place for 24 hours if possible. This wait time allows the blood vessels to close off so that you do not spark a new round of bleeding by disrupting the newly clotted blood vessels with an immediate dressing change. If the bleeding does not subside, continue to hold pressure for 40 minutes. If bleeding  continues, page your physician, contact an After Hours clinic or go to the Emergency Room. ?  

## 2023-04-07 LAB — SURGICAL PATHOLOGY

## 2023-05-07 DIAGNOSIS — H401131 Primary open-angle glaucoma, bilateral, mild stage: Secondary | ICD-10-CM | POA: Diagnosis not present

## 2023-05-09 ENCOUNTER — Other Ambulatory Visit: Payer: Self-pay

## 2023-05-09 ENCOUNTER — Ambulatory Visit
Admission: EM | Admit: 2023-05-09 | Discharge: 2023-05-09 | Disposition: A | Discharge status: Home / Self Care | Payer: BC Managed Care – PPO | Attending: Physician Assistant | Admitting: Physician Assistant

## 2023-05-09 ENCOUNTER — Encounter: Payer: Self-pay | Admitting: Emergency Medicine

## 2023-05-09 DIAGNOSIS — J441 Chronic obstructive pulmonary disease with (acute) exacerbation: Secondary | ICD-10-CM

## 2023-05-09 MED ORDER — METHYLPREDNISOLONE ACETATE 80 MG/ML IJ SUSP
60.0000 mg | Freq: Once | INTRAMUSCULAR | Status: AC
  Administered 2023-05-09: 60 mg via INTRAMUSCULAR

## 2023-05-09 MED ORDER — PREDNISONE 10 MG (21) PO TBPK: ORAL_TABLET | ORAL | 0 refills | Status: DC

## 2023-05-09 MED ORDER — BENZONATATE 100 MG PO CAPS: 100.0000 mg | ORAL_CAPSULE | Freq: Three times a day (TID) | ORAL | 0 refills | Status: DC

## 2023-05-09 MED ORDER — SPIRIVA RESPIMAT 2.5 MCG/ACT IN AERS: 2.0000 | INHALATION_SPRAY | Freq: Every day | RESPIRATORY_TRACT | 1 refills | Status: AC

## 2023-05-09 MED ORDER — ALBUTEROL SULFATE HFA 108 (90 BASE) MCG/ACT IN AERS: 1.0000 | INHALATION_SPRAY | Freq: Four times a day (QID) | RESPIRATORY_TRACT | 0 refills | Status: DC | PRN

## 2023-05-09 MED ORDER — AMOXICILLIN-POT CLAVULANATE 875-125 MG PO TABS: 1.0000 | ORAL_TABLET | Freq: Two times a day (BID) | ORAL | 0 refills | Status: DC

## 2023-05-09 MED ORDER — IPRATROPIUM-ALBUTEROL 0.5-2.5 (3) MG/3ML IN SOLN
3.0000 mL | Freq: Once | RESPIRATORY_TRACT | Status: AC
  Administered 2023-05-09: 3 mL via RESPIRATORY_TRACT

## 2023-05-09 NOTE — ED Provider Notes (Signed)
[facility]    CSN: [id number] Arrival date & time: 05/09/23  0810      History   Chief Complaint Chief Complaint  Patient presents with   Nasal Congestion   Cough    HPI Alec Key is a 53 y.o. male.   Patient presents today with a several week history of congestion, cough, shortness of breath, wheezing.  Denies any fever, chest pain, nausea, vomiting.  He has been taking Alka-Seltzer cold and flu medication without improvement of symptoms.  He denies any recent antibiotics or steroids.  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  He is not taking any medication for this condition currently.  He reports that when he wakes up in the morning his symptoms are worse and he has increased sputum production and viscosity.    Past Medical History:  Diagnosis Date   Arthritis    Avascular necrosis of bone of right hip (HCC)    Difficulty sleeping    DUE TO HIP PAIN   History of kidney stones     Patient Active Problem List   Diagnosis Date Noted   S/P right THA, AA 07/18/2014    Past Surgical History:  Procedure Laterality Date   BACK SURGERY  june 2015   L4-L5 DISCECTOMY   JOINT REPLACEMENT     TOTAL HIP ARTHROPLASTY Right 07/18/2014   Procedure: RIGHT TOTAL HIP ARTHROPLASTY ANTERIOR APPROACH;  Surgeon: Durene Romans, MD;  Location: [facility];  Service: Orthopedics;  Laterality: Right;       Home Medications    Prior to Admission medications   Medication Sig Start Date End Date Taking? Authorizing Provider  amoxicillin-clavulanate (AUGMENTIN) 875-125 MG tablet Take 1 tablet by mouth every 12 (twelve) hours. 05/09/23  Yes Natalye Kott K, PA-C  benzonatate (TESSALON) 100 MG capsule Take 1 capsule (100 mg total) by mouth every 8 (eight) hours. 05/09/23  Yes Jru Pense K, PA-C  predniSONE (STERAPRED UNI-PAK 21 TAB) 10 MG (21) TBPK tablet As directed 05/09/23  Yes Evadne Ose K,  PA-C  Tiotropium Bromide Monohydrate (SPIRIVA RESPIMAT) 2.5 MCG/ACT AERS Inhale 2 puffs into the lungs daily. 05/09/23  Yes Edithe Dobbin K, PA-C  albuterol (VENTOLIN HFA) 108 (90 Base) MCG/ACT inhaler Inhale 1-2 puffs into the lungs every 6 (six) hours as needed for wheezing or shortness of breath. 05/09/23   Lurie Mullane, Noberto Retort, PA-C  loratadine (CLARITIN) 10 MG tablet Take 10 mg by mouth daily.    [provider]    Family History Family History  Problem Relation Age of Onset   Diabetes Mother    Heart Problems Father     Social History Social History   Tobacco Use   Smoking status: Every Day    Current packs/day: 1.50    Average packs/day: 1.5 packs/day for 30.0 years (45.0 ttl pk-yrs)    Types: Cigarettes   Smokeless tobacco: Never   Tobacco comments:    Requesting prescription for assistance   Vaping Use   Vaping status: Some Days   Substances: THC  Substance Use Topics   Alcohol use: Not Currently    Alcohol/week: 42.0 standard drinks of alcohol    Types: 42 Cans of beer per week    Comment: 6 BEERS DAILY   Drug use: Yes    Types: Marijuana    Comment: occasionally     Allergies   Patient has no  known allergies.   Review of Systems Review of Systems  Constitutional:  Positive for activity change. Negative for appetite change, fatigue and fever.  HENT:  Positive for congestion and sinus pressure. Negative for sneezing and sore throat.   Respiratory:  Positive for cough, chest tightness and shortness of breath. Negative for wheezing.   Cardiovascular:  Negative for chest pain.  Gastrointestinal:  Negative for abdominal pain, diarrhea, nausea and vomiting.  Neurological:  Positive for headaches. Negative for dizziness and light-headedness.     Physical Exam Triage Vital Signs ED Triage Vitals  Encounter Vitals Group     BP 05/09/23 0948 115/81     Systolic BP Percentile --      Diastolic BP Percentile --      Pulse Rate 05/09/23 0948 68     Resp  05/09/23 0948 18     Temp 05/09/23 0948 97.6 F (36.4 C)     Temp Source 05/09/23 0948 Oral     SpO2 05/09/23 0948 96 %     Weight --      Height --      Head Circumference --      Peak Flow --      Pain Score 05/09/23 0949 0     Pain Loc --      Pain Education --      Exclude from Growth Chart --    No data found.  Updated Vital Signs BP 115/81 (BP Location: Left Arm)   Pulse 78   Temp 97.6 F (36.4 C) (Oral)   Resp 20   SpO2 97%   Visual Acuity Right Eye Distance:   Left Eye Distance:   Bilateral Distance:    Right Eye Near:   Left Eye Near:    Bilateral Near:     Physical Exam Vitals reviewed.  Constitutional:      General: He is awake.     Appearance: Normal appearance. He is well-developed. He is not ill-appearing.     Comments: Very pleasant male appears stated age in no acute distress sitting comfortably in exam room  HENT:     Head: Normocephalic and atraumatic.     Right Ear: Tympanic membrane, ear canal and external ear normal. Tympanic membrane is not erythematous or bulging.     Left Ear: Tympanic membrane, ear canal and external ear normal. Tympanic membrane is not erythematous or bulging.     Nose:     Right Sinus: Maxillary sinus tenderness present. No frontal sinus tenderness.     Left Sinus: Maxillary sinus tenderness present. No frontal sinus tenderness.     Mouth/Throat:     Pharynx: Uvula midline. Postnasal drip present. No oropharyngeal exudate, posterior oropharyngeal erythema or uvula swelling.  Cardiovascular:     Rate and Rhythm: Normal rate and regular rhythm.     Heart sounds: Normal heart sounds, S1 normal and S2 normal. No murmur heard. Pulmonary:     Effort: Pulmonary effort is normal. No accessory muscle usage or respiratory distress.     Breath sounds: No stridor. Wheezing present. No rhonchi or rales.     Comments: Widespread wheezing; improved following in clinic DuoNeb Neurological:     Mental Status: He is alert.   Psychiatric:        Behavior: Behavior is cooperative.      UC Treatments / Results  Labs (all labs ordered are listed, but only abnormal results are displayed) Labs Reviewed - No data to display  EKG   Radiology  No results found.  Procedures Procedures (including critical care time)  Medications Ordered in UC Medications  ipratropium-albuterol (DUONEB) 0.5-2.5 (3) MG/3ML nebulizer solution 3 mL (3 mLs Nebulization Given 05/09/23 1010)  methylPREDNISolone acetate (DEPO-MEDROL) injection 60 mg (60 mg Intramuscular Given 05/09/23 1011)    Initial Impression / Assessment and Plan / UC Course  I have reviewed the triage vital signs and the nursing notes.  Pertinent labs & imaging results that were available during my care of the patient were reviewed by me and considered in my medical decision making (see chart for details).     Patient is well-appearing, afebrile, nontoxic, nontachycardic.  No indication for viral testing as he has been symptomatic for several weeks and this would not change management.  Given his history of smoking and recurrent bronchitis concern for underlying COPD with current COPD exacerbation.  Will start Augmentin twice daily for 10 days.   he was given DuoNeb in clinic with some improvement of symptoms and sent home with an albuterol inhaler to be used every 4-6 hours as needed.  He was given Depo-Medrol in clinic and will start prednisone tomorrow (05/10/2023) and he was instructed to take NSAIDs with this medication.  He can use over-the-counter medicine such as Tylenol, Mucinex, Flonase for symptom relief.  He is to rest and drink plenty of fluid.  We discussed the potential utility of x-ray, however, unfortunately we do not have x-ray onsite today.  We did discuss potentially obtaining this outpatient but patient preferred to try medication and if he is not improving within a few days he will return for reevaluation at which point we consider x-ray.  Discussed  that if anything worsens he needs to go to the ER including chest pain, shortness of breath, nausea/vomiting, weakness he needs to go to the ER.  Return precautions given.  Work excuse note provided.  Final Clinical Impressions(s) / UC Diagnoses   Final diagnoses:  COPD exacerbation [facility])     Discharge Instructions      We are treating you for COPD exacerbation.  Start Augmentin twice daily for 10 days.  Start prednisone tomorrow (05/10/2023) and do not take NSAIDs with this medication including aspirin, ibuprofen/Advil, naproxen/Aleve.  Use albuterol every 4-6 hours as needed.  Start Spiriva daily for the next month.  Use Tessalon for cough.  Make sure you rest and drink plenty of fluid.  If your symptoms are not improving within a few days or if anything worsens you need to be seen including high fever, worsening cough, shortness of breath, chest pain you need to go to the ER.     ED Prescriptions     Medication Sig Dispense Auth. Provider   albuterol (VENTOLIN HFA) 108 (90 Base) MCG/ACT inhaler Inhale 1-2 puffs into the lungs every 6 (six) hours as needed for wheezing or shortness of breath. 18 g Zakye Baby K, PA-C   Tiotropium Bromide Monohydrate (SPIRIVA RESPIMAT) 2.5 MCG/ACT AERS Inhale 2 puffs into the lungs daily. 4 g Mousa Prout K, PA-C   amoxicillin-clavulanate (AUGMENTIN) 875-125 MG tablet Take 1 tablet by mouth every 12 (twelve) hours. 20 tablet Zacharia Sowles K, PA-C   predniSONE (STERAPRED UNI-PAK 21 TAB) 10 MG (21) TBPK tablet As directed 21 tablet Maggy Wyble K, PA-C   benzonatate (TESSALON) 100 MG capsule Take 1 capsule (100 mg total) by mouth every 8 (eight) hours. 21 capsule Kammi Hechler K, PA-C      PDMP not reviewed this encounter.   Patsey Pitstick, Noberto Retort,  PA-C 05/09/23 1040

## 2023-05-09 NOTE — ED Triage Notes (Signed)
Pt here for cough and sinus congestion x 1 month

## 2023-05-09 NOTE — Discharge Instructions (Signed)
We are treating you for COPD exacerbation.  Start Augmentin twice daily for 10 days.  Start prednisone tomorrow (05/10/2023) and do not take NSAIDs with this medication including aspirin, ibuprofen/Advil, naproxen/Aleve.  Use albuterol every 4-6 hours as needed.  Start Spiriva daily for the next month.  Use Tessalon for cough.  Make sure you rest and drink plenty of fluid.  If your symptoms are not improving within a few days or if anything worsens you need to be seen including high fever, worsening cough, shortness of breath, chest pain you need to go to the ER.

## 2023-05-09 NOTE — ED Notes (Signed)
 Vital signs stable.

## 2023-06-08 ENCOUNTER — Ambulatory Visit
Admission: EM | Admit: 2023-06-08 | Discharge: 2023-06-08 | Disposition: A | Discharge status: Home / Self Care | Attending: Emergency Medicine | Admitting: Emergency Medicine

## 2023-06-08 ENCOUNTER — Encounter: Payer: Self-pay | Admitting: Emergency Medicine

## 2023-06-08 DIAGNOSIS — M25562 Pain in left knee: Secondary | ICD-10-CM | POA: Diagnosis not present

## 2023-06-08 DIAGNOSIS — L03116 Cellulitis of left lower limb: Secondary | ICD-10-CM | POA: Diagnosis not present

## 2023-06-08 DIAGNOSIS — L739 Follicular disorder, unspecified: Secondary | ICD-10-CM | POA: Diagnosis not present

## 2023-06-08 DIAGNOSIS — Z760 Encounter for issue of repeat prescription: Secondary | ICD-10-CM | POA: Diagnosis not present

## 2023-06-08 MED ORDER — ALBUTEROL SULFATE HFA 108 (90 BASE) MCG/ACT IN AERS: 2.0000 | INHALATION_SPRAY | Freq: Four times a day (QID) | RESPIRATORY_TRACT | 0 refills | Status: AC | PRN

## 2023-06-08 MED ORDER — DOXYCYCLINE HYCLATE 100 MG PO CAPS: 100.0000 mg | ORAL_CAPSULE | Freq: Two times a day (BID) | ORAL | 0 refills | Status: DC

## 2023-06-08 MED ORDER — MUPIROCIN 2 % EX OINT: 1.0000 | TOPICAL_OINTMENT | Freq: Two times a day (BID) | CUTANEOUS | 0 refills | Status: AC

## 2023-06-08 NOTE — Discharge Instructions (Signed)
 Do not mess with the area, apply topical antibiotic ointment, nonadherent dressing daily.  Take antibiotic as directed.  Push fluids.  If you develop new or worsening symptoms: Increased redness, inability to bend your knee, fever, unable to keep your medications down, immediately to the emergency room for further evaluation.  We have courtesy refilled your albuterol as requested.

## 2023-06-08 NOTE — ED Triage Notes (Signed)
 Pt presents with knee pain on left knee. Symptoms onset Wednesday night and progressively got worse. Pt states knee is swollen and leaking some type of discharge. Area is hot to touch. Pain radiates down his leg. Pain is interrupting his sleep and day to day life.

## 2023-06-08 NOTE — ED Provider Notes (Signed)
 [facility]    CSN: [id number] Arrival date & time: 06/08/23  0807      History   Chief Complaint Chief Complaint  Patient presents with   Knee Pain    HPI Alec Key is a 53 y.o. male.   53 year old male, Alec Key, presents to urgent care for evaluation of left knee pain x 5 days. Pt reports sore on leg,draining, +pain. 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   The history is provided by the patient. No language interpreter was used.    Past Medical History:  Diagnosis Date   Arthritis    Avascular necrosis of bone of right hip (HCC)    Difficulty sleeping    DUE TO HIP PAIN   History of kidney stones     Patient Active Problem List   Diagnosis Date Noted   Cellulitis of left knee 06/08/2023   Folliculitis 06/08/2023   Acute pain of left knee 06/08/2023   Encounter for medication refill 06/08/2023   Osteoarthritis of left hip joint due to dysplasia 01/21/2022   Pain of left hip joint 12/13/2021   Cervical radiculopathy 04/15/2019   DDD (degenerative disc disease), cervical 02/15/2019   S/P right THA, AA 07/18/2014    Past Surgical History:  Procedure Laterality Date   BACK SURGERY  june 2015   L4-L5 DISCECTOMY   JOINT REPLACEMENT     TOTAL HIP ARTHROPLASTY Right 07/18/2014   Procedure: RIGHT TOTAL HIP ARTHROPLASTY ANTERIOR APPROACH;  Surgeon: Durene Romans, MD;  Location: [facility];  Service: Orthopedics;  Laterality: Right;       Home Medications    Prior to Admission medications   Medication Sig Start Date End Date Taking? Authorizing Provider  doxycycline (VIBRAMYCIN) 100 MG capsule Take 1 capsule (100 mg total) by mouth 2 (two) times daily for 5 days. 06/08/23 06/13/23 Yes Marlo Arriola, Para March, NP  mupirocin ointment (BACTROBAN) 2 % Apply 1 Application topically 2 (two) times daily for 7 days. Right wrist 06/08/23 06/15/23 Yes Caron Tardif,  Para March, NP  albuterol (VENTOLIN HFA) 108 (90 Base) MCG/ACT inhaler Inhale 2 puffs into the lungs every 6 (six) hours as needed for wheezing or shortness of breath. 06/08/23   Milton Sagona, Para March, NP  benzonatate (TESSALON) 100 MG capsule Take 1 capsule (100 mg total) by mouth every 8 (eight) hours. 05/09/23   Raspet, Noberto Retort, PA-C  loratadine (CLARITIN) 10 MG tablet Take 10 mg by mouth daily.    [provider]  predniSONE (STERAPRED UNI-PAK 21 TAB) 10 MG (21) TBPK tablet As directed 05/09/23   Raspet, Erin K, PA-C  Tiotropium Bromide Monohydrate (SPIRIVA RESPIMAT) 2.5 MCG/ACT AERS Inhale 2 puffs into the lungs daily. 05/09/23   Raspet, Noberto Retort, PA-C    Family History Family History  Problem Relation Age of Onset   Diabetes Mother    Heart Problems Father     Social History Social History   Tobacco Use   Smoking status: Every Day    Current packs/day: 1.50    Average packs/day: 1.5 packs/day for 30.0 years (45.0 ttl pk-yrs)    Types: Cigarettes   Smokeless tobacco: Never   Tobacco comments:    Requesting prescription for assistance   Vaping Use   Vaping status: Some Days   Substances: THC  Substance Use Topics   Alcohol use: Not Currently    Alcohol/week: 42.0  standard drinks of alcohol    Types: 42 Cans of beer per week    Comment: 6 BEERS DAILY   Drug use: Yes    Types: Marijuana    Comment: occasionally     Allergies   Patient has no known allergies.   Review of Systems Review of Systems  Constitutional:  Negative for fever.  Musculoskeletal:  Positive for arthralgias, joint swelling and myalgias.  Skin:  Positive for color change and wound.  All other systems reviewed and are negative.    Physical Exam Triage Vital Signs ED Triage Vitals  Encounter Vitals Group     BP 06/08/23 0920 121/75     Systolic BP Percentile --      Diastolic BP Percentile --      Pulse Rate 06/08/23 0920 85     Resp 06/08/23 0920 16     Temp 06/08/23 0920 98.4 F (36.9 C)      Temp Source 06/08/23 0920 Oral     SpO2 06/08/23 0920 95 %     Weight 06/08/23 0919 160 lb 15 oz (73 kg)     Height 06/08/23 0919 6\' 1"  (1.854 m)     Head Circumference --      Peak Flow --      Pain Score 06/08/23 0918 7     Pain Loc --      Pain Education --      Exclude from Growth Chart --    No data found.  Updated Vital Signs BP 121/75 (BP Location: Left Arm)   Pulse 85   Temp 98.4 F (36.9 C) (Oral)   Resp 16   Ht 6\' 1"  (1.854 m)   Wt 160 lb 15 oz (73 kg)   SpO2 95%   BMI 21.23 kg/m   Visual Acuity Right Eye Distance:   Left Eye Distance:   Bilateral Distance:    Right Eye Near:   Left Eye Near:    Bilateral Near:     Physical Exam Vitals and nursing note reviewed.  Cardiovascular:     Pulses:          Dorsalis pedis pulses are 2+ on the right side and 2+ on the left side.  Musculoskeletal:     Left knee: Swelling and erythema present. No bony tenderness. Tenderness present.       Legs:  Neurological:     General: No focal deficit present.     Mental Status: He is alert and oriented to person, place, and time.     GCS: GCS eye subscore is 4. GCS verbal subscore is 5. GCS motor subscore is 6.     Cranial Nerves: No cranial nerve deficit.     Sensory: No sensory deficit.  Psychiatric:        Attention and Perception: Attention normal.        Mood and Affect: Mood normal.        Speech: Speech normal.        Behavior: Behavior normal.      UC Treatments / Results  Labs (all labs ordered are listed, but only abnormal results are displayed) Labs Reviewed - No data to display  EKG   Radiology No results found.  Procedures Procedures (including critical care time)  Medications Ordered in UC Medications - No data to display  Initial Impression / Assessment and Plan / UC Course  I have reviewed the triage vital signs and the nursing notes.  Pertinent labs & imaging results  that were available during my care of the patient were reviewed by  me and considered in my medical decision making (see chart for details).  Clinical Course as of 06/08/23 0942  Mon Jun 08, 2023  0934 Pt requesting refill on albuterol as [facility] said script didn't go through last time, refilled as courtesy. [JD]    Clinical Course User Index [JD] Berdell Nevitt, Para March, NP   Discussed exam findings and plan of care with patient, strict go to ER precautions given.   Patient verbalized understanding to this provider.  Ddx: Left knee cellulitis, folliculitis, medication refill Final Clinical Impressions(s) / UC Diagnoses   Final diagnoses:  Cellulitis of left knee  Folliculitis  Acute pain of left knee  Encounter for medication refill     Discharge Instructions      Do not mess with the area, apply topical antibiotic ointment, nonadherent dressing daily.  Take antibiotic as directed.  Push fluids.  If you develop new or worsening symptoms: Increased redness, inability to bend your knee, fever, unable to keep your medications down, immediately to the emergency room for further evaluation.  We have courtesy refilled your albuterol as requested.     ED Prescriptions     Medication Sig Dispense Auth. Provider   albuterol (VENTOLIN HFA) 108 (90 Base) MCG/ACT inhaler Inhale 2 puffs into the lungs every 6 (six) hours as needed for wheezing or shortness of breath. 25.5 g Zarinah Oviatt, NP   doxycycline (VIBRAMYCIN) 100 MG capsule Take 1 capsule (100 mg total) by mouth 2 (two) times daily for 5 days. 10 capsule Tobie Hellen, NP   mupirocin ointment (BACTROBAN) 2 % Apply 1 Application topically 2 (two) times daily for 7 days. Right wrist 22 g Petros Ahart, Para March, NP      PDMP not reviewed this encounter.   Clancy Gourd, NP 06/08/23 [phone]

## 2023-06-11 ENCOUNTER — Ambulatory Visit: Admission: EM | Admit: 2023-06-11 | Discharge: 2023-06-11 | Disposition: A | Discharge status: Home / Self Care

## 2023-06-11 ENCOUNTER — Other Ambulatory Visit: Payer: Self-pay

## 2023-06-11 ENCOUNTER — Emergency Department (HOSPITAL_COMMUNITY)
Admission: EM | Admit: 2023-06-11 | Discharge: 2023-06-11 | Disposition: A | Discharge status: Home / Self Care | Attending: Emergency Medicine | Admitting: Emergency Medicine

## 2023-06-11 DIAGNOSIS — Z7982 Long term (current) use of aspirin: Secondary | ICD-10-CM | POA: Diagnosis not present

## 2023-06-11 DIAGNOSIS — L02416 Cutaneous abscess of left lower limb: Secondary | ICD-10-CM | POA: Diagnosis not present

## 2023-06-11 DIAGNOSIS — D72829 Elevated white blood cell count, unspecified: Secondary | ICD-10-CM | POA: Diagnosis not present

## 2023-06-11 DIAGNOSIS — L03116 Cellulitis of left lower limb: Secondary | ICD-10-CM | POA: Diagnosis not present

## 2023-06-11 LAB — COMPREHENSIVE METABOLIC PANEL
ALT: 18 U/L (ref 0–44)
AST: 18 U/L (ref 15–41)
Albumin: 3.2 g/dL — ABNORMAL LOW (ref 3.5–5.0)
Alkaline Phosphatase: 69 U/L (ref 38–126)
Anion gap: 9 (ref 5–15)
BUN: 11 mg/dL (ref 6–20)
CO2: 26 mmol/L (ref 22–32)
Calcium: 9.6 mg/dL (ref 8.9–10.3)
Chloride: 102 mmol/L (ref 98–111)
Creatinine, Ser: 1.1 mg/dL (ref 0.61–1.24)
GFR, Estimated: >60 mL/min (ref >60)
Glucose, Bld: 104 mg/dL — ABNORMAL HIGH (ref 70–99)
Potassium: 3.6 mmol/L (ref 3.5–5.1)
Sodium: 137 mmol/L (ref 135–145)
Total Bilirubin: 0.3 mg/dL (ref 0.0–1.2)
Total Protein: 7.2 g/dL (ref 6.5–8.1)

## 2023-06-11 LAB — CBC WITH DIFFERENTIAL/PLATELET
Abs Immature Granulocytes: 0.1 10*3/uL — ABNORMAL HIGH (ref 0.00–0.07)
Basophils Absolute: 0.1 10*3/uL (ref 0.0–0.1)
Basophils Relative: 0 %
Eosinophils Absolute: 0.3 10*3/uL (ref 0.0–0.5)
Eosinophils Relative: 2 %
HCT: 43.4 % (ref 39.0–52.0)
Hemoglobin: 14.5 g/dL (ref 13.0–17.0)
Immature Granulocytes: 1 %
Lymphocytes Relative: 13 %
Lymphs Abs: 1.8 10*3/uL (ref 0.7–4.0)
MCH: 31 pg (ref 26.0–34.0)
MCHC: 33.4 g/dL (ref 30.0–36.0)
MCV: 92.9 fL (ref 80.0–100.0)
Monocytes Absolute: 1.2 10*3/uL — ABNORMAL HIGH (ref 0.1–1.0)
Monocytes Relative: 9 %
Neutro Abs: 10.5 10*3/uL — ABNORMAL HIGH (ref 1.7–7.7)
Neutrophils Relative %: 75 %
Platelets: 397 10*3/uL (ref 150–400)
RBC: 4.67 MIL/uL (ref 4.22–5.81)
RDW: 13.6 % (ref 11.5–15.5)
WBC: 13.9 10*3/uL — ABNORMAL HIGH (ref 4.0–10.5)
nRBC: 0 % (ref 0.0–0.2)

## 2023-06-11 LAB — I-STAT CG4 LACTIC ACID, ED: Lactic Acid, Venous: 1.1 mmol/L (ref 0.5–1.9)

## 2023-06-11 MED ORDER — LIDOCAINE-EPINEPHRINE (PF) 2 %-1:200000 IJ SOLN
10.0000 mL | Freq: Once | INTRAMUSCULAR | Status: AC
  Administered 2023-06-11: 10 mL via INTRADERMAL
  Filled 2023-06-11: qty 20

## 2023-06-11 MED ORDER — AMOXICILLIN 500 MG PO TABS: 500.0000 mg | ORAL_TABLET | ORAL | 0 refills | Status: DC

## 2023-06-11 MED ORDER — SULFAMETHOXAZOLE-TRIMETHOPRIM 800-160 MG PO TABS: 1.0000 | ORAL_TABLET | Freq: Two times a day (BID) | ORAL | 0 refills | Status: AC

## 2023-06-11 NOTE — ED Triage Notes (Signed)
"  I came in Monday for this Knee Pain due to an infection on my skin on knee but I am now concerned it is not improving, I also believe I have Gout". No fever.

## 2023-06-11 NOTE — ED Notes (Signed)
 Dressing applied to L knee per order; non-adherent pad applied to wound and wrapped knee with coban.

## 2023-06-11 NOTE — ED Notes (Signed)
 Patient is being discharged from the Urgent Care and sent to the Emergency Department via Private Vehicle (Self) . Per R. Izola Price PA, patient is in need of higher level of care due to CT (Imaging) due to skin/knee infection not improving. Patient is aware and verbalizes understanding of plan of care.  Vitals:   06/11/23 0818  BP: 132/78  Pulse: 94  Resp: 18  Temp: 98.1 F (36.7 C)  SpO2: 99%

## 2023-06-11 NOTE — ED Provider Notes (Signed)
 [facility]    CSN: [id number] Arrival date & time: 06/11/23  0806      History   Chief Complaint Chief Complaint  Patient presents with   Skin Problem    HPI Alec Key is a 53 y.o. male.   Patient here today for evaluation of continued knee pain and redness that has been ongoing since last Saturday.  He reports that redness has certainly worsened since then.  He was seen on Monday in office here and was prescribed antibiotics and states he has been taking as prescribed however symptoms do not seem to be improving.  He does report joint pain but notes he also has gout and is not sure if this is related.  He has not had any fever.  The history is provided by the patient.    Past Medical History:  Diagnosis Date   Arthritis    Avascular necrosis of bone of right hip (HCC)    Difficulty sleeping    DUE TO HIP PAIN   History of kidney stones     Patient Active Problem List   Diagnosis Date Noted   Cellulitis of left knee 06/08/2023   Folliculitis 06/08/2023   Acute pain of left knee 06/08/2023   Encounter for medication refill 06/08/2023   Osteoarthritis of left hip joint due to dysplasia 01/21/2022   Pain of left hip joint 12/13/2021   Cervical radiculopathy 04/15/2019   DDD (degenerative disc disease), cervical 02/15/2019   S/P right THA, AA 07/18/2014    Past Surgical History:  Procedure Laterality Date   BACK SURGERY  june 2015   L4-L5 DISCECTOMY   JOINT REPLACEMENT     TOTAL HIP ARTHROPLASTY Right 07/18/2014   Procedure: RIGHT TOTAL HIP ARTHROPLASTY ANTERIOR APPROACH;  Surgeon: Durene Romans, MD;  Location: [facility];  Service: Orthopedics;  Laterality: Right;       Home Medications    Prior to Admission medications   Medication Sig Start Date End Date Taking? Authorizing Provider  amoxicillin (AMOXIL) 500 MG tablet Take 500 mg by mouth as directed. 03/26/22  Yes [provider]  doxycycline (VIBRAMYCIN) 100 MG capsule Take 1 capsule  (100 mg total) by mouth 2 (two) times daily for 5 days. 06/08/23 06/13/23 Yes Defelice, Para March, NP  mupirocin ointment (BACTROBAN) 2 % Apply 1 Application topically 2 (two) times daily for 7 days. Right wrist 06/08/23 06/15/23 Yes Defelice, Para March, NP  omeprazole (PRILOSEC) 40 MG capsule Take 1 tablet by mouth daily.   Yes [provider]  Tiotropium Bromide Monohydrate (SPIRIVA RESPIMAT) 2.5 MCG/ACT AERS Inhale 2 puffs into the lungs daily. 05/09/23  Yes Raspet, Noberto Retort, PA-C  acetaminophen (TYLENOL) 500 MG tablet Take 500 mg by mouth every 6 (six) hours as needed. 05/26/14   [provider]  albuterol (VENTOLIN HFA) 108 (90 Base) MCG/ACT inhaler Inhale 2 puffs into the lungs every 6 (six) hours as needed for wheezing or shortness of breath. 06/08/23   Defelice, Para March, NP  aspirin 81 MG chewable tablet Chew 81 mg by mouth daily.    [provider]  benzonatate (TESSALON) 100 MG capsule Take 1 capsule (100 mg total) by mouth every 8 (eight) hours. 05/09/23   Raspet, Erin K, PA-C  bimatoprost (LUMIGAN) 0.01 % SOLN Place 1 drop into both eyes at bedtime.    [provider]  diclofenac (VOLTAREN) 75 MG EC tablet Take 75 mg by mouth 2 (two) times daily. 12/18/21   [provider]  Latanoprost 0.005 %  EMUL Place 1 drop into both eyes at bedtime.    [provider]  loratadine (CLARITIN) 10 MG tablet Take 10 mg by mouth daily.    [provider]  LUMIGAN 0.01 % SOLN Place 1 drop into both eyes at bedtime.    [provider]  omeprazole (PRILOSEC) 20 MG capsule Take 20 mg by mouth every morning. 03/22/23   [provider]  predniSONE (STERAPRED UNI-PAK 21 TAB) 10 MG (21) TBPK tablet As directed 05/09/23   Raspet, Erin K, PA-C  SUMAtriptan (IMITREX) 100 MG tablet Take 100 mg by mouth every 2 (two) hours as needed. 12/26/22   [provider]  traMADol (ULTRAM) 50 MG tablet Take 50 mg by mouth every 6 (six) hours as needed. 05/26/14    [provider]    Family History Family History  Problem Relation Age of Onset   Diabetes Mother    Heart Problems Father     Social History Social History   Tobacco Use   Smoking status: Every Day    Current packs/day: 1.50    Average packs/day: 1.5 packs/day for 30.0 years (45.0 ttl pk-yrs)    Types: Cigarettes   Smokeless tobacco: Never   Tobacco comments:    Requesting prescription for assistance   Vaping Use   Vaping status: Some Days   Substances: Nicotine, Flavoring  Substance Use Topics   Alcohol use: Not Currently    Alcohol/week: 42.0 standard drinks of alcohol    Types: 42 Cans of beer per week    Comment: 6 BEERS DAILY   Drug use: Yes    Types: Marijuana    Comment: occasionally     Allergies   Patient has no known allergies.   Review of Systems Review of Systems  Constitutional:  Negative for chills and fever.  Eyes:  Negative for discharge and redness.  Musculoskeletal:  Positive for arthralgias.  Skin:  Positive for color change and wound.  Neurological:  Negative for numbness.     Physical Exam Triage Vital Signs ED Triage Vitals  Encounter Vitals Group     BP 06/11/23 0818 132/78     Systolic BP Percentile --      Diastolic BP Percentile --      Pulse Rate 06/11/23 0818 94     Resp 06/11/23 0818 18     Temp 06/11/23 0818 98.1 F (36.7 C)     Temp Source 06/11/23 0818 Oral     SpO2 06/11/23 0818 99 %     Weight 06/11/23 0816 190 lb (86.2 kg)     Height 06/11/23 0816 6' (1.829 m)     Head Circumference --      Peak Flow --      Pain Score 06/11/23 0815 8     Pain Loc --      Pain Education --      Exclude from Growth Chart --    No data found.  Updated Vital Signs BP 132/78 (BP Location: Left Arm)   Pulse 94   Temp 98.1 F (36.7 C) (Oral)   Resp 18   Ht 6' (1.829 m)   Wt 190 lb (86.2 kg)   SpO2 99%   BMI 25.77 kg/m   Visual Acuity Right Eye Distance:   Left Eye Distance:   Bilateral Distance:    Right  Eye Near:   Left Eye Near:    Bilateral Near:     Physical Exam Vitals and nursing note reviewed.  Constitutional:      General: He is not in acute distress.    Appearance: Normal appearance. He is not ill-appearing.  HENT:     Head: Normocephalic and atraumatic.  Eyes:     Conjunctiva/sclera: Conjunctivae normal.  Cardiovascular:     Rate and Rhythm: Normal rate.  Pulmonary:     Effort: Pulmonary effort is normal. No respiratory distress.  Skin:    Comments: Large area of erythema surrounding small central wound to lateral left knee.  Neurological:     Mental Status: He is alert.  Psychiatric:        Mood and Affect: Mood normal.        Behavior: Behavior normal.        Thought Content: Thought content normal.      UC Treatments / Results  Labs (all labs ordered are listed, but only abnormal results are displayed) Labs Reviewed - No data to display  EKG   Radiology No results found.  Procedures Procedures (including critical care time)  Medications Ordered in UC Medications - No data to display  Initial Impression / Assessment and Plan / UC Course  I have reviewed the triage vital signs and the nursing notes.  Pertinent labs & imaging results that were available during my care of the patient were reviewed by me and considered in my medical decision making (see chart for details).    Given suspected failure of oral antibiotic therapy recommended further evaluation in the emergency room for CT scan to rule out intra-articular involvement of infection and possible IV antibiotics.  Patient is agreeable to same.  He is stable to transport via POV.  Final Clinical Impressions(s) / UC Diagnoses   Final diagnoses:  Cellulitis of left knee   Discharge Instructions   None    ED Prescriptions   None    PDMP not reviewed this encounter.   Tomi Bamberger, PA-C 06/11/23 [phone]

## 2023-06-11 NOTE — Discharge Instructions (Addendum)
 You have been diagnosed with an abscess involving the skin of your left knee.  It was incised and drained in the ER.  Please apply warm moist compress to the affected area several times daily to aid with healing.  Please discontinue your doxycycline and take the 2 antibiotics I prescribed as treatment of your infection.  Take medication with food and also eat yogurt high in probiotic while taking antibiotic to decrease risk of antibiotic associated diarrhea.  You may take over-the-counter Tylenol or ibuprofen as needed for aches and pain.  Return if you have any concern.

## 2023-06-11 NOTE — ED Provider Notes (Signed)
 [place] EMERGENCY DEPARTMENT AT [facility] Provider Note   CSN: [id number] Arrival date & time: 06/11/23  1829     History  Chief Complaint  Patient presents with   Knee Pain   Cellulitis    Alec Key is a 53 y.o. male.  The history is provided by the patient and medical records. No language interpreter was used.  Knee Pain Associated symptoms: no fever      53 year old male history of arthritis, DDD presenting with complaint of left knee pain.  A week ago he noticed a small bump on his left knee.  Several days later he went to urgent care center for evaluation and was diagnosed with cellulitis.  He was prescribed doxycycline.  He has been taking medication for approximately 4 days but noticed worsening of symptoms.  He describes throbbing achy pain with increased redness and swelling to the affected area.  He feels the symptoms more on the skin and not so much in his knee joint.  He does not endorse any fever or chills no trauma.  No history of gout.  He is up-to-date with tetanus.  Home Medications Prior to Admission medications   Medication Sig Start Date End Date Taking? Authorizing Provider  acetaminophen (TYLENOL) 500 MG tablet Take 500 mg by mouth every 6 (six) hours as needed. 05/26/14   [provider]  albuterol (VENTOLIN HFA) 108 (90 Base) MCG/ACT inhaler Inhale 2 puffs into the lungs every 6 (six) hours as needed for wheezing or shortness of breath. 06/08/23   Defelice, Para March, NP  amoxicillin (AMOXIL) 500 MG tablet Take 500 mg by mouth as directed. 03/26/22   [provider]  aspirin 81 MG chewable tablet Chew 81 mg by mouth daily.    [provider]  benzonatate (TESSALON) 100 MG capsule Take 1 capsule (100 mg total) by mouth every 8 (eight) hours. 05/09/23   Raspet, Erin K, PA-C  bimatoprost (LUMIGAN) 0.01 % SOLN Place 1 drop into both eyes at bedtime.    [provider]  diclofenac (VOLTAREN) 75 MG EC tablet Take 75 mg  by mouth 2 (two) times daily. 12/18/21   [provider]  doxycycline (VIBRAMYCIN) 100 MG capsule Take 1 capsule (100 mg total) by mouth 2 (two) times daily for 5 days. 06/08/23 06/13/23  Defelice, Para March, NP  Latanoprost 0.005 % EMUL Place 1 drop into both eyes at bedtime.    [provider]  loratadine (CLARITIN) 10 MG tablet Take 10 mg by mouth daily.    [provider]  LUMIGAN 0.01 % SOLN Place 1 drop into both eyes at bedtime.    [provider]  mupirocin ointment (BACTROBAN) 2 % Apply 1 Application topically 2 (two) times daily for 7 days. Right wrist 06/08/23 06/15/23  Defelice, Para March, NP  omeprazole (PRILOSEC) 20 MG capsule Take 20 mg by mouth every morning. 03/22/23   [provider]  omeprazole (PRILOSEC) 40 MG capsule Take 1 tablet by mouth daily.    [provider]  predniSONE (STERAPRED UNI-PAK 21 TAB) 10 MG (21) TBPK tablet As directed 05/09/23   Raspet, Erin K, PA-C  SUMAtriptan (IMITREX) 100 MG tablet Take 100 mg by mouth every 2 (two) hours as needed. 12/26/22   [provider]  Tiotropium Bromide Monohydrate (SPIRIVA RESPIMAT) 2.5 MCG/ACT AERS Inhale 2 puffs into the lungs daily. 05/09/23   Raspet, Noberto Retort, PA-C  traMADol (ULTRAM) 50 MG tablet Take 50 mg by mouth every 6 (six) hours  as needed. 05/26/14   [provider]      Allergies    Patient has no known allergies.    Review of Systems   Review of Systems  Constitutional:  Negative for fever.  Skin:  Positive for wound.    Physical Exam Updated Vital Signs BP 124/85 (BP Location: Right Arm)   Pulse 97   Temp 97.9 F (36.6 C)   Resp 16   SpO2 100%  Physical Exam Constitutional:      General: He is not in acute distress.    Appearance: He is well-developed.  HENT:     Head: Atraumatic.  Eyes:     Conjunctiva/sclera: Conjunctivae normal.  Musculoskeletal:        General: Tenderness (Left knee: There is an area of induration, erythema, and  fluctuant noted to the lateral aspect of the knee with a central ulceration.  It is tender to palpation.  Able to flex and extend knee without difficulty.) present.     Cervical back: Normal range of motion and neck supple.  Skin:    Findings: No rash.  Neurological:     Mental Status: He is alert.     ED Results / Procedures / Treatments   Labs (all labs ordered are listed, but only abnormal results are displayed) Labs Reviewed  COMPREHENSIVE METABOLIC PANEL - Abnormal; Notable for the following components:      Result Value   Glucose, Bld 104 (*)    Albumin 3.2 (*)    All other components within normal limits  CBC WITH DIFFERENTIAL/PLATELET - Abnormal; Notable for the following components:   WBC 13.9 (*)    Neutro Abs 10.5 (*)    Monocytes Absolute 1.2 (*)    Abs Immature Granulocytes 0.10 (*)    All other components within normal limits  URINALYSIS, W/ REFLEX TO CULTURE (INFECTION SUSPECTED)  I-STAT CG4 LACTIC ACID, ED  I-STAT CG4 LACTIC ACID, ED    EKG None  Radiology No results found.  Procedures .Incision and Drainage  Date/Time: 06/11/2023 12:09 PM  Performed by: Fayrene Helper, PA-C Authorized by: Fayrene Helper, PA-C   Consent:    Consent obtained:  Verbal   Consent given by:  Patient   Risks discussed:  Bleeding, incomplete drainage, pain and damage to other organs   Alternatives discussed:  No treatment Universal protocol:    Procedure explained and questions answered to patient or proxy's satisfaction: yes     Relevant documents present and verified: yes     Test results available : yes     Imaging studies available: yes     Required blood products, implants, devices, and special equipment available: yes     Site/side marked: yes     Immediately prior to procedure, a time out was called: yes     Patient identity confirmed:  Verbally with patient Location:    Type:  Abscess   Size:  4   Location:  Lower extremity   Lower extremity location:  Knee   Knee  location:  L knee Pre-procedure details:    Skin preparation:  Betadine Anesthesia:    Anesthesia method:  Local infiltration   Local anesthetic:  Lidocaine 1% WITH epi Procedure type:    Complexity:  Complex Procedure details:    Incision types:  Single straight   Incision depth:  Subcutaneous   Wound management:  Probed and deloculated, irrigated with saline and extensive cleaning   Drainage:  Purulent   Drainage amount:  Moderate  Packing materials:  None Post-procedure details:    Procedure completion:  Tolerated well, no immediate complications     Medications Ordered in ED Medications  lidocaine-EPINEPHrine (XYLOCAINE W/EPI) 2 %-1:200000 (PF) injection 10 mL (has no administration in time range)    ED Course/ Medical Decision Making/ A&P                                 Medical Decision Making Amount and/or Complexity of Data Reviewed Labs: ordered.  Risk Prescription drug management.   BP 124/85 (BP Location: Right Arm)   Pulse 97   Temp 97.9 F (36.6 C)   Resp 16   SpO2 100%   42:43 PM 53 year old male history of arthritis, DDD presenting with complaint of left knee pain.  A week ago he noticed a small bump on his left knee.  Several days later he went to urgent care center for evaluation and was diagnosed with cellulitis.  He was prescribed doxycycline.  He has been taking medication for approximately 4 days but noticed worsening of symptoms.  He describes throbbing achy pain with increased redness and swelling to the affected area.  He feels the symptoms more on the skin and not so much in his knee joint.  He does not endorse any fever or chills no trauma.  No history of gout.  He is up-to-date with tetanus.  Exam notable for cellulitis/abscess involving the lateral aspect of the left knee without any joint involvement.  DDx: Cellulitis, abscess, septic joint, fracture, dislocation, contusion, gout  Finding suggestive of skin abscess requiring  I&D.  -Labs ordered, independently viewed and interpreted by me.  Labs remarkable for WBC 13.9.  normal lactic acid -The patient was maintained on a cardiac monitor.  I personally viewed and interpreted the cardiac monitored which showed an underlying rhythm of: NSR -Imaging including MRI of L knee considered but felt low yield -This patient presents to the ED for concern of knee pain, this involves an extensive number of treatment options, and is a complaint that carries with it a high risk of complications and morbidity.  The differential diagnosis includes cellulitis, abscess, septic joint, gout, fx, dislocation -Co morbidities that complicate the patient evaluation includes DDD, arthritis -Treatment includes lidocaine, I&D -Reevaluation of the patient after these medicines showed that the patient improved -PCP office notes or outside notes reviewed -I&D performed and I was able to expressed a moderate amount of purulent discharge -Escalation to admission/observation considered: patients feels much better, is comfortable with discharge, and will follow up with PCP -Prescription medication considered, patient comfortable with stopping docycycline and will take bactrim along with keflex.  Work note provided -Social Determinant of Health considered which includes tobacco use         Final Clinical Impression(s) / ED Diagnoses Final diagnoses:  Cutaneous abscess of left knee    Rx / DC Orders ED Discharge Orders          Ordered    amoxicillin (AMOXIL) 500 MG tablet  As directed        06/11/23 1254    sulfamethoxazole-trimethoprim (BACTRIM DS) 800-160 MG tablet  2 times daily        06/11/23 1254              Fayrene Helper, PA-C 06/11/23 1256    Estelle June A, DO 06/18/23 1429

## 2023-06-11 NOTE — ED Triage Notes (Signed)
 Pt. Stated, left. Knee pain that started last Wednesday. I went to UC on Monday and was given antibiotic and my knee seems like its gotten worse since then. Pt's left knee is red and swollen.

## 2023-08-04 DIAGNOSIS — H401131 Primary open-angle glaucoma, bilateral, mild stage: Secondary | ICD-10-CM | POA: Diagnosis not present

## 2023-09-13 ENCOUNTER — Encounter: Payer: Self-pay | Admitting: *Deleted

## 2023-09-13 ENCOUNTER — Other Ambulatory Visit: Payer: Self-pay

## 2023-09-13 ENCOUNTER — Ambulatory Visit
Admission: EM | Admit: 2023-09-13 | Discharge: 2023-09-13 | Disposition: A | Discharge status: Home / Self Care | Attending: Physician Assistant | Admitting: Physician Assistant

## 2023-09-13 DIAGNOSIS — J40 Bronchitis, not specified as acute or chronic: Secondary | ICD-10-CM

## 2023-09-13 DIAGNOSIS — J329 Chronic sinusitis, unspecified: Secondary | ICD-10-CM | POA: Diagnosis not present

## 2023-09-13 MED ORDER — PREDNISONE 20 MG PO TABS: 40.0000 mg | ORAL_TABLET | Freq: Every day | ORAL | 0 refills | Status: AC

## 2023-09-13 MED ORDER — AMOXICILLIN-POT CLAVULANATE 875-125 MG PO TABS
1.0000 | ORAL_TABLET | Freq: Two times a day (BID) | ORAL | 0 refills | Status: AC
Start: 2023-09-13 — End: ?

## 2023-09-13 NOTE — ED Provider Notes (Signed)
 [facility]    CSN: [id number] Arrival date & time: 09/13/23  0802      History   Chief Complaint Chief Complaint  Patient presents with   Facial Pain    HPI Alec Key is a 53 y.o. male.   Patient here today for evaluation of sinus congestion and pressure has had for the last week.  He also reports some chest congestion and cough.  He denies any known respiratory issues but does state he may have undiagnosed COPD as he has been a smoker for 30 years.  He does note some mild shortness of breath with activity.  He denies any fever that he is aware of.  The history is provided by the patient.    Past Medical History:  Diagnosis Date   Arthritis    Avascular necrosis of bone of right hip (HCC)    Difficulty sleeping    DUE TO HIP PAIN   History of kidney stones     Patient Active Problem List   Diagnosis Date Noted   Cellulitis of left knee 06/08/2023   Folliculitis 06/08/2023   Acute pain of left knee 06/08/2023   Encounter for medication refill 06/08/2023   Osteoarthritis of left hip joint due to dysplasia 01/21/2022   Pain of left hip joint 12/13/2021   Cervical radiculopathy 04/15/2019   DDD (degenerative disc disease), cervical 02/15/2019   S/P right THA, AA 07/18/2014    Past Surgical History:  Procedure Laterality Date   BACK SURGERY  june 2015   L4-L5 DISCECTOMY   JOINT REPLACEMENT     TOTAL HIP ARTHROPLASTY Right 07/18/2014   Procedure: RIGHT TOTAL HIP ARTHROPLASTY ANTERIOR APPROACH;  Surgeon: Claiborne Crew, MD;  Location: [facility];  Service: Orthopedics;  Laterality: Right;       Home Medications    Prior to Admission medications   Medication Sig Start Date End Date Taking? Authorizing Provider  albuterol  (VENTOLIN  HFA) 108 (90 Base) MCG/ACT inhaler Inhale 2 puffs into the lungs every 6 (six) hours as needed for wheezing or shortness of breath. 06/08/23  Yes Defelice, Jeanette, NP  amoxicillin -clavulanate (AUGMENTIN ) 875-125 MG tablet Take 1  tablet by mouth every 12 (twelve) hours. 09/13/23  Yes Vernestine Gondola, PA-C  bimatoprost (LUMIGAN) 0.01 % SOLN Place 1 drop into both eyes at bedtime.   Yes [provider]  diclofenac (VOLTAREN) 75 MG EC tablet Take 75 mg by mouth 2 (two) times daily. 12/18/21  Yes [provider]  predniSONE  (DELTASONE ) 20 MG tablet Take 2 tablets (40 mg total) by mouth daily with breakfast for 5 days. 09/13/23 09/18/23 Yes Vernestine Gondola, PA-C  acetaminophen  (TYLENOL ) 500 MG tablet Take 500 mg by mouth every 6 (six) hours as needed. 05/26/14   [provider]  aspirin  81 MG chewable tablet Chew 81 mg by mouth daily.    [provider]  Latanoprost 0.005 % EMUL Place 1 drop into both eyes at bedtime.    [provider]  Tiotropium Bromide Monohydrate  (SPIRIVA  RESPIMAT) 2.5 MCG/ACT AERS Inhale 2 puffs into the lungs daily. 05/09/23   Raspet, Betsey Brow, PA-C    Family History Family History  Problem Relation Age of Onset   Diabetes Mother    Heart Problems Father     Social History Social History   Tobacco Use   Smoking status: Every Day    Current packs/day: 1.50    Average packs/day: 1.5 packs/day for 30.0 years (45.0 ttl pk-yrs)  Types: Cigarettes   Smokeless tobacco: Never   Tobacco comments:    Requesting prescription for assistance   Vaping Use   Vaping status: Some Days   Substances: Nicotine, Flavoring  Substance Use Topics   Alcohol use: Not Currently    Alcohol/week: 42.0 standard drinks of alcohol    Types: 42 Cans of beer per week    Comment: 6 BEERS DAILY   Drug use: Yes    Types: Marijuana    Comment: occasionally     Allergies   Patient has no known allergies.   Review of Systems Review of Systems  Constitutional:  Negative for chills and fever.  HENT:  Positive for congestion and sore throat. Negative for ear pain.   Eyes:  Negative for discharge and redness.  Respiratory:  Positive for cough, shortness of breath and wheezing.    Gastrointestinal:  Negative for abdominal pain, nausea and vomiting.     Physical Exam Triage Vital Signs ED Triage Vitals  Encounter Vitals Group     BP      Systolic BP Percentile      Diastolic BP Percentile      Pulse      Resp      Temp      Temp src      SpO2      Weight      Height      Head Circumference      Peak Flow      Pain Score      Pain Loc      Pain Education      Exclude from Growth Chart    No data found.  Updated Vital Signs BP 130/78   Pulse 70   Temp 98.3 F (36.8 C)   Resp 18   SpO2 94%   Visual Acuity Right Eye Distance:   Left Eye Distance:   Bilateral Distance:    Right Eye Near:   Left Eye Near:    Bilateral Near:     Physical Exam Vitals and nursing note reviewed.  Constitutional:      General: He is not in acute distress.    Appearance: He is well-developed. He is not ill-appearing.  HENT:     Head: Normocephalic and atraumatic.     Right Ear: Tympanic membrane normal.     Left Ear: Tympanic membrane normal.     Nose: Congestion present.     Mouth/Throat:     Mouth: Mucous membranes are moist.     Pharynx: No oropharyngeal exudate or posterior oropharyngeal erythema.     Tonsils: 0 on the right. 0 on the left.  Eyes:     Conjunctiva/sclera: Conjunctivae normal.  Cardiovascular:     Rate and Rhythm: Normal rate and regular rhythm.     Heart sounds: Normal heart sounds. No murmur heard. Pulmonary:     Effort: Pulmonary effort is normal. No respiratory distress.     Breath sounds: Wheezing (mild diffuse wheezing) present. No rhonchi or rales.  Skin:    General: Skin is warm and dry.  Neurological:     Mental Status: He is alert.  Psychiatric:        Mood and Affect: Mood normal.        Behavior: Behavior normal.      UC Treatments / Results  Labs (all labs ordered are listed, but only abnormal results are displayed) Labs Reviewed - No data to display  EKG   Radiology No results  found.  Procedures Procedures (including critical care time)  Medications Ordered in UC Medications - No data to display  Initial Impression / Assessment and Plan / UC Course  I have reviewed the triage vital signs and the nursing notes.  Pertinent labs & imaging results that were available during my care of the patient were reviewed by me and considered in my medical decision making (see chart for details).    Treated for sinobronchitis with steroid burst as well as Augmentin .  Advised to follow-up if no gradual improvement with any worsening symptoms.  Final Clinical Impressions(s) / UC Diagnoses   Final diagnoses:  Sinobronchitis   Discharge Instructions   None    ED Prescriptions     Medication Sig Dispense Auth. Provider   predniSONE  (DELTASONE ) 20 MG tablet Take 2 tablets (40 mg total) by mouth daily with breakfast for 5 days. 10 tablet Jami Mcclintock F, PA-C   amoxicillin -clavulanate (AUGMENTIN ) 875-125 MG tablet Take 1 tablet by mouth every 12 (twelve) hours. 14 tablet Vernestine Gondola, PA-C      PDMP not reviewed this encounter.   Vernestine Gondola, PA-C 09/13/23 1301

## 2023-09-13 NOTE — ED Triage Notes (Signed)
 PT reports sinus problems for 1 week  ,with congestion and cough.

## 2023-11-26 DIAGNOSIS — G4733 Obstructive sleep apnea (adult) (pediatric): Secondary | ICD-10-CM | POA: Diagnosis not present

## 2024-02-27 DIAGNOSIS — G4733 Obstructive sleep apnea (adult) (pediatric): Secondary | ICD-10-CM | POA: Diagnosis not present
